# Patient Record
Sex: Male | Born: 1935 | Race: White | Hispanic: No | Marital: Married | Smoking: Never smoker
Health system: Southern US, Community
[De-identification: ages and names within clinical notes are randomized; demographics above are authoritative.]

## PROBLEM LIST (undated history)

## (undated) DIAGNOSIS — I4891 Unspecified atrial fibrillation: Secondary | ICD-10-CM

## (undated) DIAGNOSIS — I340 Nonrheumatic mitral (valve) insufficiency: Secondary | ICD-10-CM

## (undated) DIAGNOSIS — Z9289 Personal history of other medical treatment: Secondary | ICD-10-CM

## (undated) DIAGNOSIS — F0280 Dementia in other diseases classified elsewhere without behavioral disturbance: Secondary | ICD-10-CM

## (undated) DIAGNOSIS — I2510 Atherosclerotic heart disease of native coronary artery without angina pectoris: Secondary | ICD-10-CM

## (undated) DIAGNOSIS — G309 Alzheimer's disease, unspecified: Secondary | ICD-10-CM

## (undated) HISTORY — DX: Unspecified atrial fibrillation: I48.91

## (undated) HISTORY — DX: Nonrheumatic mitral (valve) insufficiency: I34.0

## (undated) HISTORY — PX: TOTAL KNEE ARTHROPLASTY: SHX125

## (undated) HISTORY — PX: SURGERY SCROTAL / TESTICULAR: SUR1316

## (undated) HISTORY — PX: APPENDECTOMY: SHX54

## (undated) HISTORY — PX: CORONARY ARTERY BYPASS GRAFT: SHX141

## (undated) HISTORY — DX: Atherosclerotic heart disease of native coronary artery without angina pectoris: I25.10

## (undated) HISTORY — DX: Personal history of other medical treatment: Z92.89

---

## 1998-09-25 HISTORY — PX: CARDIAC CATHETERIZATION: SHX172

## 1998-12-01 ENCOUNTER — Inpatient Hospital Stay (HOSPITAL_COMMUNITY): Admission: RE | Admit: 1998-12-01 | Discharge: 1998-12-09 | Payer: Self-pay | Admitting: Cardiology

## 1998-12-02 ENCOUNTER — Encounter: Payer: Self-pay | Admitting: Surgery

## 1998-12-03 ENCOUNTER — Encounter: Payer: Self-pay | Admitting: Surgery

## 1998-12-07 ENCOUNTER — Encounter: Payer: Self-pay | Admitting: Surgery

## 1998-12-08 ENCOUNTER — Encounter: Payer: Self-pay | Admitting: Surgery

## 2002-07-04 ENCOUNTER — Encounter: Payer: Self-pay | Admitting: Orthopaedic Surgery

## 2002-07-04 ENCOUNTER — Ambulatory Visit (HOSPITAL_COMMUNITY): Admission: RE | Admit: 2002-07-04 | Discharge: 2002-07-04 | Payer: Self-pay | Admitting: Orthopaedic Surgery

## 2002-08-07 ENCOUNTER — Ambulatory Visit (HOSPITAL_COMMUNITY): Admission: RE | Admit: 2002-08-07 | Discharge: 2002-08-07 | Payer: Self-pay | Admitting: Orthopaedic Surgery

## 2004-09-27 ENCOUNTER — Ambulatory Visit: Payer: Self-pay | Admitting: Family Medicine

## 2005-04-17 ENCOUNTER — Ambulatory Visit: Payer: Self-pay | Admitting: Internal Medicine

## 2005-04-17 ENCOUNTER — Ambulatory Visit (HOSPITAL_COMMUNITY): Admission: RE | Admit: 2005-04-17 | Discharge: 2005-04-17 | Payer: Self-pay | Admitting: Internal Medicine

## 2006-09-25 DIAGNOSIS — Z9289 Personal history of other medical treatment: Secondary | ICD-10-CM

## 2006-09-25 HISTORY — DX: Personal history of other medical treatment: Z92.89

## 2009-06-23 ENCOUNTER — Inpatient Hospital Stay (HOSPITAL_COMMUNITY): Admission: RE | Admit: 2009-06-23 | Discharge: 2009-06-25 | Payer: Self-pay | Admitting: Orthopedic Surgery

## 2009-07-26 ENCOUNTER — Encounter: Admission: RE | Admit: 2009-07-26 | Discharge: 2009-09-23 | Payer: Self-pay | Admitting: Orthopedic Surgery

## 2010-05-03 ENCOUNTER — Ambulatory Visit (HOSPITAL_COMMUNITY): Admission: RE | Admit: 2010-05-03 | Discharge: 2010-05-03 | Payer: Self-pay | Admitting: Cardiology

## 2010-05-03 ENCOUNTER — Encounter (INDEPENDENT_AMBULATORY_CARE_PROVIDER_SITE_OTHER): Payer: Self-pay | Admitting: Cardiology

## 2010-06-22 ENCOUNTER — Inpatient Hospital Stay (HOSPITAL_COMMUNITY): Admission: RE | Admit: 2010-06-22 | Discharge: 2010-06-24 | Payer: Self-pay | Admitting: Orthopedic Surgery

## 2010-07-18 ENCOUNTER — Encounter
Admission: RE | Admit: 2010-07-18 | Discharge: 2010-09-22 | Payer: Self-pay | Source: Home / Self Care | Attending: Orthopedic Surgery | Admitting: Orthopedic Surgery

## 2010-12-08 LAB — PROTIME-INR
INR: 1.19 (ref 0.00–1.49)
INR: 1.22 (ref 0.00–1.49)
INR: 1.78 — ABNORMAL HIGH (ref 0.00–1.49)
INR: 2.01 — ABNORMAL HIGH (ref 0.00–1.49)
Prothrombin Time: 15.3 seconds — ABNORMAL HIGH (ref 11.6–15.2)
Prothrombin Time: 15.6 seconds — ABNORMAL HIGH (ref 11.6–15.2)
Prothrombin Time: 20.9 seconds — ABNORMAL HIGH (ref 11.6–15.2)

## 2010-12-08 LAB — CBC
Hemoglobin: 12 g/dL — ABNORMAL LOW (ref 13.0–17.0)
MCH: 33.1 pg (ref 26.0–34.0)
MCH: 33.1 pg (ref 26.0–34.0)
MCHC: 35 g/dL (ref 30.0–36.0)
Platelets: 146 10*3/uL — ABNORMAL LOW (ref 150–400)
Platelets: 164 10*3/uL (ref 150–400)
Platelets: 177 10*3/uL (ref 150–400)
RBC: 3.62 MIL/uL — ABNORMAL LOW (ref 4.22–5.81)
RBC: 4.03 MIL/uL — ABNORMAL LOW (ref 4.22–5.81)
RDW: 13.2 % (ref 11.5–15.5)
WBC: 10.1 10*3/uL (ref 4.0–10.5)

## 2010-12-08 LAB — URINALYSIS, ROUTINE W REFLEX MICROSCOPIC
Bilirubin Urine: NEGATIVE
Glucose, UA: NEGATIVE mg/dL
Hgb urine dipstick: NEGATIVE
Specific Gravity, Urine: 1.012 (ref 1.005–1.030)
Urobilinogen, UA: 1 mg/dL (ref 0.0–1.0)

## 2010-12-08 LAB — TYPE AND SCREEN
ABO/RH(D): A POS
Antibody Screen: NEGATIVE

## 2010-12-08 LAB — BASIC METABOLIC PANEL
BUN: 18 mg/dL (ref 6–23)
Calcium: 8.4 mg/dL (ref 8.4–10.5)
Calcium: 8.7 mg/dL (ref 8.4–10.5)
Chloride: 106 mEq/L (ref 96–112)
Creatinine, Ser: 1.36 mg/dL (ref 0.4–1.5)
Creatinine, Ser: 1.42 mg/dL (ref 0.4–1.5)
GFR calc Af Amer: 59 mL/min — ABNORMAL LOW (ref >60)
GFR calc Af Amer: >60 mL/min (ref >60)
GFR calc non Af Amer: 48 mL/min — ABNORMAL LOW (ref >60)
GFR calc non Af Amer: 51 mL/min — ABNORMAL LOW (ref >60)
Sodium: 136 mEq/L (ref 135–145)

## 2010-12-08 LAB — URINE MICROSCOPIC-ADD ON

## 2010-12-08 LAB — COMPREHENSIVE METABOLIC PANEL
AST: 26 U/L (ref 0–37)
Albumin: 3.6 g/dL (ref 3.5–5.2)
BUN: 18 mg/dL (ref 6–23)
Calcium: 9.2 mg/dL (ref 8.4–10.5)
Creatinine, Ser: 1.31 mg/dL (ref 0.4–1.5)
GFR calc Af Amer: >60 mL/min (ref >60)
GFR calc non Af Amer: 53 mL/min — ABNORMAL LOW (ref >60)

## 2010-12-08 LAB — APTT
aPTT: 31 seconds (ref 24–37)
aPTT: 42 seconds — ABNORMAL HIGH (ref 24–37)

## 2010-12-08 LAB — SURGICAL PCR SCREEN: Staphylococcus aureus: NEGATIVE

## 2010-12-29 LAB — BASIC METABOLIC PANEL
BUN: 21 mg/dL (ref 6–23)
Calcium: 8.3 mg/dL — ABNORMAL LOW (ref 8.4–10.5)
GFR calc non Af Amer: 46 mL/min — ABNORMAL LOW (ref >60)
Glucose, Bld: 178 mg/dL — ABNORMAL HIGH (ref 70–99)
Potassium: 4.4 mEq/L (ref 3.5–5.1)
Sodium: 135 mEq/L (ref 135–145)

## 2010-12-29 LAB — CBC
HCT: 32.4 % — ABNORMAL LOW (ref 39.0–52.0)
Hemoglobin: 11.1 g/dL — ABNORMAL LOW (ref 13.0–17.0)
Platelets: 131 10*3/uL — ABNORMAL LOW (ref 150–400)
RDW: 13.4 % (ref 11.5–15.5)
WBC: 14.6 10*3/uL — ABNORMAL HIGH (ref 4.0–10.5)

## 2010-12-29 LAB — PROTIME-INR: INR: 1.6 — ABNORMAL HIGH (ref 0.00–1.49)

## 2010-12-30 LAB — COMPREHENSIVE METABOLIC PANEL
ALT: 28 U/L (ref 0–53)
AST: 28 U/L (ref 0–37)
Albumin: 3.7 g/dL (ref 3.5–5.2)
Alkaline Phosphatase: 52 U/L (ref 39–117)
CO2: 27 mEq/L (ref 19–32)
Chloride: 102 mEq/L (ref 96–112)
GFR calc Af Amer: >60 mL/min (ref >60)
GFR calc non Af Amer: 57 mL/min — ABNORMAL LOW (ref >60)
Potassium: 4.5 mEq/L (ref 3.5–5.1)
Sodium: 136 mEq/L (ref 135–145)
Total Bilirubin: 0.9 mg/dL (ref 0.3–1.2)

## 2010-12-30 LAB — CBC
HCT: 38.3 % — ABNORMAL LOW (ref 39.0–52.0)
MCHC: 34 g/dL (ref 30.0–36.0)
MCV: 99.8 fL (ref 78.0–100.0)
Platelets: 168 10*3/uL (ref 150–400)
Platelets: 179 10*3/uL (ref 150–400)
RBC: 4.45 MIL/uL (ref 4.22–5.81)
RDW: 12.8 % (ref 11.5–15.5)
WBC: 7.6 10*3/uL (ref 4.0–10.5)

## 2010-12-30 LAB — TYPE AND SCREEN
ABO/RH(D): A POS
Antibody Screen: NEGATIVE

## 2010-12-30 LAB — BASIC METABOLIC PANEL
BUN: 19 mg/dL (ref 6–23)
CO2: 26 mEq/L (ref 19–32)
Chloride: 101 mEq/L (ref 96–112)
Creatinine, Ser: 1.46 mg/dL (ref 0.4–1.5)
Glucose, Bld: 143 mg/dL — ABNORMAL HIGH (ref 70–99)

## 2010-12-30 LAB — URINALYSIS, ROUTINE W REFLEX MICROSCOPIC
Bilirubin Urine: NEGATIVE
Nitrite: NEGATIVE
Specific Gravity, Urine: 1.014 (ref 1.005–1.030)
pH: 7 (ref 5.0–8.0)

## 2010-12-30 LAB — PROTIME-INR
INR: 1.2 (ref 0.00–1.49)
Prothrombin Time: 15.9 seconds — ABNORMAL HIGH (ref 11.6–15.2)

## 2010-12-30 LAB — APTT: aPTT: 36 seconds (ref 24–37)

## 2011-02-10 NOTE — H&P (Signed)
NAME:  Joseph Pena, Joseph Pena                        ACCOUNT NO.:  [id number]   MEDICAL RECORD NO.:  [id number]                   PATIENT TYPE:  OUT   LOCATION:  RAD                                  FACILITY:  [facility]   PHYSICIAN:  J. Darreld Mclean, M.D.              DATE OF BIRTH:  November 30, 1930   DATE OF ADMISSION:  07/04/2002  DATE OF DISCHARGE:  07/04/2002                                HISTORY & PHYSICAL   CHIEF COMPLAINT:  Left knee pain.   HISTORY OF PRESENT ILLNESS:  The patient has been having pain to his left  knee for several months now.  It has gotten progressively worse.  MRI of the  knee shows tricompartmental disk disease with tear of the medial and lateral  menisci.  Questionable tear of the ACL.  The patient has not improved with  conservative treatment, and desires to have surgery.  The patient has had  significant problems with his heart in the past.  Was seen by [facility] by Dr. Jenne Campus, and found that he could undergo  the procedure.  There was no evidence of ischemia.  He had a Cardiolite  study done and there was a 68% ejection fraction.  They felt he was stable  to undergo the procedure.  He had a normal left ventricular systolic  function, moderate left ventricular hypertrophy, marked left atrial  enlargement, and moderate right atrial enlargement.  Moderate pulmonary  hypertension.  Aortic sclerosis with no stenosis.  No significant change  from previous studies done on 03/07/02.   MEDICATIONS:  1. Toprol 12.5 mg q.d.  2. Zocor 40 mg q.d.  3. Vitamin E q.d.  4. Fish oil vitamins.  5. Aspirin.  6. Aleve.  7. Bextra 10 mg.  8. Darvocet-N 100.   ALLERGIES:  No known drug allergies.   HABITS:  Does not smoke, does not drink.   PRIMARY CARE PHYSICIAN:  Dr. Dewaine Conger.   CARDIOLOGIST:  Dr. Jenne Campus.   PAST MEDICAL HISTORY:  1. Appendectomy at 47.  2. Heart bypass surgery in 2000.   FAMILY HISTORY:  Heart disease runs in the family  rather strongly.   SOCIAL HISTORY:  The patient is married and retired.   PHYSICAL EXAMINATION:  VITAL SIGNS:  Blood pressure is 140/94, pulse 74,  respirations 16, afebrile, height 5 feet 7 inches, weight 197.  GENERAL:  The patient is alert, cooperative, and oriented.  HEENT:  Negative.  NECK:  Supple.  LUNGS:  Clear to auscultation and percussion.  HEART:  Regular without murmurs.  ABDOMEN:  Soft, nontender, no masses.  EXTREMITIES:  Left knee tender, mild effusion, pain and tenderness medially  and laterally to the knee.  The right knee is negative.  Other extremities  within normal limits.  NEUROLOGIC:  Grossly normal.   IMPRESSION:  Left knee tear of medial and lateral meniscus.   PLAN:  Operative arthroscopy.  The patient does have heart disease.  This  was discussed, but he is cleared for this, but he is at increased risk and  understands.  Would recommend spinal anesthesia.  Laboratories pending.                                                 Teola Bradley, M.D.    JWK/MEDQ  D:  February 07, 202003  T:  February 07, 202003  Job:  [id number]

## 2011-02-10 NOTE — Op Note (Signed)
NAME:  Joseph Pena, Joseph Pena                        ACCOUNT NO.:  [id number]   MEDICAL RECORD NO.:  [id number]                   PATIENT TYPE:  AMB   LOCATION:  DAY                                  FACILITY:  [facility]   PHYSICIAN:  J. Darreld Mclean, M.D.              DATE OF BIRTH:  Feb 13, 1931   DATE OF PROCEDURE:  08/07/2002  DATE OF DISCHARGE:                                 OPERATIVE REPORT   PREOPERATIVE DIAGNOSIS:  Tear of the medial and lateral meniscus of the left  knee.   POSTOPERATIVE DIAGNOSIS:  Tear of the medial and lateral meniscus of the  left knee.   PROCEDURES:  1. Operative arthroscopy of the left knee.  2. Partial medial and lateral meniscectomy.   SURGEON:  J. Darreld Mclean, M.D.   ASSISTANT:  Candace Cruise, P.A.-C   ANESTHESIA:  Spinal.   TOURNIQUET TIME:  25 minutes.   DRAINS:  None.   INDICATIONS:  The patient is a 77 year old male with pain and tenderness of  the left knee, giving way, pain, locking.  MRI shows tear of the medial  meniscus and lateral meniscus with significant DJD medially.  Surgery is  recommended as conservative treatment has not been successful.  I got  clearance from his cardiac physicians.  Risks and imponderables discussed in  detail with the patient, who appeared to understand and agreed with the  procedure as outlined.   FINDINGS:  The suprapatellar pouch had mild synovitis, and inspection of the  patella revealed grade 2-3 changes.  Medially there were significant grade 4  changes with eburnation down to the bone.  There was a tear of the medial  meniscus, a stellate tear, complex.  No loose bodies.  Anterior cruciate was  intact but is incompetent with tears within the ligament itself.  Laterally  there was a tear of the anterior portion of the lateral meniscus.  The  posterior portion looked good.  The bone quality is much better here with  grade 2 changes.  No loose bodies.   DESCRIPTION OF PROCEDURE:  The patient was placed  supine on the operating  room table and spinal anesthesia was given.  The patient was prepped and  draped in the usual manner.  His leg was elevated, exsanguinated with an  Esmarch bandage, the tourniquet inflated to 300 mmHg.  Esmarch bandage  removed.  Before making an incision, we reverified we had this patient and  we were doing the left knee for an arthroscopy.   An incision was made and a cannula was placed medially, lactated Ringers  instilled in the knee by an infusion pump.  Arthroscope was entered  laterally and the knee systematically examined.  Please see findings above.  Permanent pictures were taken.  The knee with a marked stellate tear, and  there were significant degenerative changes.   The meniscus was then debrided and cut using a meniscal shaver and  a punch.  A good smooth contour was obtained.  Laterally, the meniscus was approached.  There was a tear of the anterior lip most forward, and this area was  debrided using the meniscal shaver and then further with meniscal shaver and  got a good smooth contour.   The knee was systematically re-examined and no pathology found.  The knee  was irrigated with lactated Ringers.  The wounds were reapproximated using 3-  0 nylon interrupted vertical mattress.  Marcaine 0.25% instilled in each  portal.  The tourniquet deflated after 25 minutes, sterile dressing applied,  bulky dressing applied, knee immobilizer applied.   The patient went to recovery in good condition.  The patient given Darvocet-  N 100 for pain.  I will see him in the office in approximately 10-14 days.  Physical therapy has been arranged.  Any difficulty or problems, contact us  through the office or hospital beeper system, numbers have been given.  He  understands this.                                               Teola Bradley, M.D.    JWK/MEDQ  D:  08/07/2002  T:  08/07/2002  Job:  [id number]

## 2011-02-10 NOTE — Op Note (Signed)
NAME:  Joseph Pena, Joseph Pena              ACCOUNT NO.:  [id number]   MEDICAL RECORD NO.:  [id number]          PATIENT TYPE:  AMB   LOCATION:  DAY                           FACILITY:  [facility]   PHYSICIAN:  R. Roetta Sessions, M.D. DATE OF BIRTH:  April 04, 1931   DATE OF PROCEDURE:  04/17/2005  DATE OF DISCHARGE:                                 OPERATIVE REPORT   INDICATIONS FOR PROCEDURE:  The patient is a 77 year old gentleman referred  via courtesy of Dr. Christian Mate for colorectal cancer screening.  He is  devoid of any lower GI tract symptoms.  He has never had his colon imaged.  There is no family history of colorectal neoplasia.  Colonoscopy is being  done as a screening maneuver.  This procedure was discussed with the  patient.  Potential risks, benefits and alternatives were reviewed,  questions answered, he is agreeable.  It is notable the patient has a  prominent 2-3/6 systolic ejection murmur.  He says that this has been  checked out previously, and he does not require SBE prophylaxis.   OPERATIVE PROCEDURE:  Screening colonoscopy.   SURGEON:  R. Roetta Sessions, M.D.   DESCRIPTION OF PROCEDURE:  O2 saturation, blood pressure, pulse,  respirations were monitored throughout the procedure.  Conscious sedation  with versed 3 mg IV, Demerol 75 mg IV in divided doses.  Olympus video  system.   FINDINGS:  Digital rectal exam revealed no abnormalities.  The prep was  adequate from rectum to colon.  Examination of the rectal mucosa to the  retroflexion view of the anal verge revealed only a single anal papilla.  Colonic mucosa was surveyed from the rectum to the sigmoid junction through  the left, transverse, right colon, appendiceal orifice, ileocecal valve and  cecum.  These structures were well seen and photographed for the record.  The Olympus scope was slowly and cautiously withdrawn.  All previously  mentioned mucosal surfaces were again seen.  The patient was noted to have  sigmoid  diverticulum.  The remainder of the colonic mucosa appeared normal.  The patient tolerated the procedure well, was reactive.   IMPRESSION:  1.  Left-sided diverticulum.  The remainder of the colonic mucosa appeared      normal.  2.  Single anal papilla, otherwise normal rectum.   RECOMMENDATIONS:  1.  Diverticulosis literature provided to Mr. Rosengrant.  2.  He was noted to have couplets on the monitor throughout the procedure.      We will go ahead and get a 12-lead EKG in PACU as a reference point.  He      really has not had any chest pain, dyspnea or palpitations recently.       RMR/MEDQ  D:  04/17/2005  T:  04/17/2005  Job:  [id number]   cc:   [address]  [place]  [place] [address]  Fax: [phone]

## 2011-09-26 DIAGNOSIS — Z9289 Personal history of other medical treatment: Secondary | ICD-10-CM

## 2011-09-26 HISTORY — DX: Personal history of other medical treatment: Z92.89

## 2012-12-01 ENCOUNTER — Ambulatory Visit: Payer: Self-pay | Admitting: Cardiovascular Disease

## 2012-12-01 DIAGNOSIS — I4891 Unspecified atrial fibrillation: Secondary | ICD-10-CM | POA: Insufficient documentation

## 2012-12-01 DIAGNOSIS — Z7901 Long term (current) use of anticoagulants: Secondary | ICD-10-CM

## 2013-02-12 ENCOUNTER — Ambulatory Visit: Payer: Self-pay

## 2013-02-19 ENCOUNTER — Ambulatory Visit (INDEPENDENT_AMBULATORY_CARE_PROVIDER_SITE_OTHER): Payer: Medicare Other | Admitting: Pharmacist Clinician (PhC)/ Clinical Pharmacy Specialist

## 2013-02-19 DIAGNOSIS — I4891 Unspecified atrial fibrillation: Secondary | ICD-10-CM

## 2013-02-19 DIAGNOSIS — Z7901 Long term (current) use of anticoagulants: Secondary | ICD-10-CM

## 2013-02-24 ENCOUNTER — Other Ambulatory Visit: Payer: Self-pay | Admitting: *Deleted

## 2013-02-24 MED ORDER — WARFARIN SODIUM 5 MG PO TABS: ORAL_TABLET | ORAL | Status: DC

## 2013-02-24 NOTE — Telephone Encounter (Signed)
rx for warfarin

## 2013-02-26 NOTE — Progress Notes (Signed)
Needs provider

## 2013-04-24 ENCOUNTER — Ambulatory Visit (INDEPENDENT_AMBULATORY_CARE_PROVIDER_SITE_OTHER): Payer: Self-pay | Admitting: Pharmacist

## 2013-04-24 DIAGNOSIS — I4891 Unspecified atrial fibrillation: Secondary | ICD-10-CM

## 2013-04-24 DIAGNOSIS — Z7901 Long term (current) use of anticoagulants: Secondary | ICD-10-CM

## 2013-06-09 ENCOUNTER — Telehealth: Payer: Self-pay | Admitting: Pharmacist Clinician (PhC)/ Clinical Pharmacy Specialist

## 2013-06-09 NOTE — Telephone Encounter (Signed)
Overdue INR letter sent 

## 2013-06-20 ENCOUNTER — Ambulatory Visit: Payer: Self-pay | Admitting: Pharmacist Clinician (PhC)/ Clinical Pharmacy Specialist

## 2013-06-20 DIAGNOSIS — Z7901 Long term (current) use of anticoagulants: Secondary | ICD-10-CM

## 2013-06-20 DIAGNOSIS — I4891 Unspecified atrial fibrillation: Secondary | ICD-10-CM

## 2013-07-21 ENCOUNTER — Other Ambulatory Visit: Payer: Self-pay | Admitting: Cardiovascular Disease

## 2013-07-21 NOTE — Telephone Encounter (Signed)
Rx for Lisinopril and Toprol XL were sent to pharmacy electronically.

## 2013-08-04 ENCOUNTER — Encounter: Payer: Self-pay | Admitting: Cardiovascular Disease

## 2013-08-15 ENCOUNTER — Ambulatory Visit (INDEPENDENT_AMBULATORY_CARE_PROVIDER_SITE_OTHER): Payer: Medicare Other | Admitting: Cardiovascular Disease

## 2013-08-15 ENCOUNTER — Encounter: Payer: Self-pay | Admitting: Cardiovascular Disease

## 2013-08-15 VITALS — BP 140/80 | HR 81 | Ht 69.0 in | Wt 185.0 lb

## 2013-08-15 DIAGNOSIS — I4891 Unspecified atrial fibrillation: Secondary | ICD-10-CM

## 2013-08-15 DIAGNOSIS — E785 Hyperlipidemia, unspecified: Secondary | ICD-10-CM | POA: Insufficient documentation

## 2013-08-15 DIAGNOSIS — I2510 Atherosclerotic heart disease of native coronary artery without angina pectoris: Secondary | ICD-10-CM

## 2013-08-15 DIAGNOSIS — I10 Essential (primary) hypertension: Secondary | ICD-10-CM | POA: Insufficient documentation

## 2013-08-15 NOTE — Progress Notes (Signed)
Patient ID: Joseph Pena, male   DOB: 1931/03/02, 79 y.o.   MRN: [id number]     HPI: Joseph Pena is a 79 y.o. male to the office for six-month cardiology evaluation.  Joseph Pena has established coronary artery disease in 2000 underwent CABG surgery by Dr. Cristie Hem for the LIMA to the LAD, RIMA graft to the RCA. Additional problems include permanent atrial fibrillation for which she's been on chronic Coumadin therapy, hypertension, hyperlipidemia, as well as left bundle branch block. His last nuclear perfusion study done in August 2013 show mild septal abnormality secondary to his bundle branch block. His last echo Doppler study revealed an ejection fraction at 50-55% with moderate MR, moderate TR, and mild AR which was done in August 2013.  Past 6 months, Joseph Pena continues to do well. He denies recurrent chest pain. He does note some fatigue. He is unaware of his heart racing. He denies presyncope or syncope. He also has a history of hyperlipidemia and hypertension.  Past history is also notable for knee surgery 2009 with repeat 2010. He underwent CABG surgery in 2000 there History reviewed. No pertinent past surgical history.  No Known Allergies  Current Outpatient Prescriptions  Medication Sig Dispense Refill  . aspirin 81 MG tablet Take 81 mg by mouth daily.      Joseph Pena 550 MG CAPS Take 2 capsules by mouth daily.      Joseph Pena Kitchen ezetimibe-simvastatin (VYTORIN) 10-40 MG per tablet Take 1 tablet by mouth daily.      Joseph Pena Kitchen lisinopril (PRINIVIL,ZESTRIL) 2.5 MG tablet TAKE 1 TABLET DAILY  30 tablet  6  . metoprolol succinate (TOPROL-XL) 25 MG 24 hr tablet TAKE 1 TABLET DAILY  30 tablet  6  . Multiple Vitamin (MULTIVITAMIN) capsule Take 1 capsule by mouth daily.      Joseph Pena Kitchen warfarin (COUMADIN) 5 MG tablet Take 1 and 1/2 tablets once daily or as directed  45 tablet  3   No current facility-administered medications for this visit.    History   Social History  . Marital Status: Married      Spouse Name: N/A    Number of Children: N/A  . Years of Education: N/A   Occupational History  . Not on file.   Social History Main Topics  . Smoking status: Former Games developer  . Smokeless tobacco: Former Neurosurgeon     Comment: smoked as a teen.  . Alcohol Use: No  . Drug Use: Not on file  . Sexual Activity: Not on file   Other Topics Concern  . Not on file   Social History Narrative  . No narrative on file    Family History  Problem Relation Age of Onset  . Heart disease Father    Distal social history is notable that he is married for 34 years. He is retired. He completed eighth grade education. He has 2 children and 2 grandchildren  ROS is negative for fevers, chills or night sweats.   Joseph Pena to his wife, he is beginning to have some short term memory issues. He denies any rash. He does note some discomfort in the left trapezius region. He denies chest pain. He denies cough or sputum production. He denies wheezing. He denies presyncope. He denies  abdominal symptoms. He denies any blood in his stool or urine. There is no claudication. He denies paresthesias or there is no diabetes history. He denies excess bleeding on Coumadin. He denies cold or heat intolerance. no blood in his  Other comprehensive 12 point system review is negative.  PE BP 140/80  Pulse 81  Ht 5\' 9"  (1.753 m)  Wt 185 lb (83.915 kg)  BMI 27.31 kg/m2  General: Alert, oriented, no distress.  Skin: normal turgor, no rashes HEENT: Normocephalic, atraumatic. Pupils round and reactive; sclera anicteric;no lid lag.  Nose without nasal septal hypertrophy Mouth/Parynx benign; Mallinpatti scale 2 Neck: No JVD, no carotid briuts Lungs: clear to ausculatation and percussion; no wheezing or rales Heart: Irregularly irregular rhythm, s1 s2 normal 1/6 systolic murmur Abdomen: soft, nontender; no hepatosplenomehaly, BS+; abdominal aorta nontender and not dilated by palpation. Pulses 2+ Extremities: no clubbing cyanosis or  edema, Homan's sign negative  Neurologic: grossly nonfocal Psychologic: normal affect and mood.  ECG: Atrial fibrillation with a lipoma branch block and repolarization changes with ventricular rate approximately 80  LABS:  BMET    Component Value Date/Time   NA 136 06/24/2010 0340   K 4.3 06/24/2010 0340   CL 107 06/24/2010 0340   CO2 23 06/24/2010 0340   GLUCOSE 132* 06/24/2010 0340   BUN 18 06/24/2010 0340   CREATININE 1.36 06/24/2010 0340   CALCIUM 8.4 06/24/2010 0340   GFRNONAA 51* 06/24/2010 0340   GFRAA  Value: >60        The eGFR has been calculated using the MDRD equation. This calculation has not been validated in all clinical situations. eGFR's persistently <60 mL/min signify possible Chronic Kidney Disease. 06/24/2010 0340     Hepatic Function Panel     Component Value Date/Time   PROT 6.8 06/17/2010 1226   ALBUMIN 3.6 06/17/2010 1226   AST 26 06/17/2010 1226   ALT 27 06/17/2010 1226   ALKPHOS 56 06/17/2010 1226   BILITOT 0.7 06/17/2010 1226     CBC    Component Value Date/Time   WBC 12.1* 06/24/2010 0340   RBC 3.62* 06/24/2010 0340   HGB 12.0* 06/24/2010 0340   HCT 34.6* 06/24/2010 0340   PLT 146* 06/24/2010 0340   MCV 95.6 06/24/2010 0340   MCH 33.1 06/24/2010 0340   MCHC 34.7 06/24/2010 0340   RDW 13.2 06/24/2010 0340     BNP No results found for this basename: probnp    Lipid Panel  No results found for this basename: chol, trig, hdl, cholhdl, vldl, ldlcalc     RADIOLOGY: No results found.    ASSESSMENT AND PLAN:  Joseph Pena is now 79 years old. He is 14-1/2 years status post CABG revascularization surgery with bilateral arterial conduits. He has permanent atrial fibrillation on Coumadin therapy and his ventricular rate is controlled at approximately 80 with low dose Toprol-XL 25 mg daily. He also is on lisinopril 2.5 mg. His blood pressure is controlled. He does have hyperlipidemia on Vytorin. He tells me Joseph Pena and Joseph Pena have been  checking laboratory. I have previously been seeing Joseph Pena in the [facility] [place] office which is now closed. We discussed with them their desire to potentially to still have opportunity to be seen in [place]. I informed him that I will no longer be going to the [place] office. I will be happy to see him anytime in the future in [place] and we'll tentatively set him appointment for 6 months but if they prefer to be seen in the [facility] medical [facility] heart care office at any point hospital we can arrange a six-month followup appointment in reasonable to be seen by one of the [facility]  physicians  in [place].   Lennette Bihari, MD, [facility]  08/15/2013 9:57 AM

## 2013-08-15 NOTE — Patient Instructions (Signed)
Your physician recommends that you schedule a follow-up appointment in: 6 MONTHS. No changes were made in your therapy today. 

## 2013-11-24 DIAGNOSIS — I4891 Unspecified atrial fibrillation: Secondary | ICD-10-CM | POA: Diagnosis not present

## 2013-12-22 DIAGNOSIS — F3289 Other specified depressive episodes: Secondary | ICD-10-CM | POA: Diagnosis not present

## 2013-12-22 DIAGNOSIS — I4891 Unspecified atrial fibrillation: Secondary | ICD-10-CM | POA: Diagnosis not present

## 2013-12-22 DIAGNOSIS — E785 Hyperlipidemia, unspecified: Secondary | ICD-10-CM | POA: Diagnosis not present

## 2013-12-22 DIAGNOSIS — E559 Vitamin D deficiency, unspecified: Secondary | ICD-10-CM | POA: Diagnosis not present

## 2013-12-22 DIAGNOSIS — F329 Major depressive disorder, single episode, unspecified: Secondary | ICD-10-CM | POA: Diagnosis not present

## 2013-12-22 DIAGNOSIS — N400 Enlarged prostate without lower urinary tract symptoms: Secondary | ICD-10-CM | POA: Diagnosis not present

## 2013-12-22 DIAGNOSIS — Z125 Encounter for screening for malignant neoplasm of prostate: Secondary | ICD-10-CM | POA: Diagnosis not present

## 2013-12-22 DIAGNOSIS — E039 Hypothyroidism, unspecified: Secondary | ICD-10-CM | POA: Diagnosis not present

## 2013-12-22 DIAGNOSIS — Z79899 Other long term (current) drug therapy: Secondary | ICD-10-CM | POA: Diagnosis not present

## 2013-12-22 DIAGNOSIS — I10 Essential (primary) hypertension: Secondary | ICD-10-CM | POA: Diagnosis not present

## 2014-02-02 DIAGNOSIS — I4891 Unspecified atrial fibrillation: Secondary | ICD-10-CM | POA: Diagnosis not present

## 2014-02-12 ENCOUNTER — Encounter: Payer: Self-pay | Admitting: *Deleted

## 2014-02-17 DIAGNOSIS — H2510 Age-related nuclear cataract, unspecified eye: Secondary | ICD-10-CM | POA: Diagnosis not present

## 2014-02-18 ENCOUNTER — Encounter: Payer: Self-pay | Admitting: Cardiology

## 2014-02-18 ENCOUNTER — Ambulatory Visit (INDEPENDENT_AMBULATORY_CARE_PROVIDER_SITE_OTHER): Payer: Medicare Other | Admitting: Cardiology

## 2014-02-18 VITALS — BP 133/77 | HR 80 | Ht 70.0 in | Wt 187.0 lb

## 2014-02-18 DIAGNOSIS — I4891 Unspecified atrial fibrillation: Secondary | ICD-10-CM

## 2014-02-18 MED ORDER — METOPROLOL SUCCINATE ER 25 MG PO TB24: ORAL_TABLET | ORAL | Status: AC

## 2014-02-18 NOTE — Progress Notes (Signed)
    HPI The patient presents for follow up of CAD.  He is new to me.  He had CABG as below.  His last stress test in 2013 showed no ischemia.  His last echo showed moderate MR.  He saw Dr. Tresa Endo.  Since he was last seen he says that he has done very well.  The patient denies any new symptoms such as chest discomfort, neck or arm discomfort. There has been no new shortness of breath, PND or orthopnea. There have been no reported palpitations, presyncope or syncope.  No Known Allergies  Current Outpatient Prescriptions  Medication Sig Dispense Refill  . aspirin 81 MG tablet Take 81 mg by mouth daily.      Marland Kitchen escitalopram (LEXAPRO) 10 MG tablet 5 mg daily.       Marland Kitchen ezetimibe-simvastatin (VYTORIN) 10-40 MG per tablet Take 1 tablet by mouth daily.      . metoprolol succinate (TOPROL-XL) 25 MG 24 hr tablet TAKE 1 TABLET DAILY  30 tablet  6  . Multiple Vitamin (MULTIVITAMIN) capsule Take 1 capsule by mouth daily.      Marland Kitchen warfarin (COUMADIN) 5 MG tablet Take 1 and 1/2 tablets once daily or as directed  45 tablet  3   No current facility-administered medications for this visit.    Past Medical History  Diagnosis Date  . Atrial fibrillation   . CAD (coronary artery disease)   . Mitral regurgitation     Past Surgical History  Procedure Laterality Date  . Coronary artery bypass graft    . Total knee arthroplasty      bilateral  . Appendectomy    . Surgery scrotal / testicular      ROS:  As stated in the HPI and negative for all other systems.  PHYSICAL EXAM BP 133/77  Pulse 80  Ht 5\' 10"  (1.778 m)  Wt 187 lb (84.823 kg)  BMI 26.83 kg/m2 GENERAL:  Well appearing HEENT:  Pupils equal round and reactive, fundi not visualized, oral mucosa unremarkable NECK:  No jugular venous distention, waveform within normal limits, carotid upstroke brisk and symmetric, no bruits, no thyromegaly LYMPHATICS:  No cervical, inguinal adenopathy LUNGS:  Clear to auscultation bilaterally BACK:  No CVA  tenderness CHEST:  Well healed sternotomy scar. HEART:  PMI not displaced or sustained,S1 and S2 within normal limits, no S3, no S4, no clicks, no rubs, apical short systolic murmur non radiating, no diastolic murmurs ABD:  Flat, positive bowel sounds normal in frequency in pitch, no bruits, no rebound, no guarding, no midline pulsatile mass, no hepatomegaly, no splenomegaly EXT:  2 plus pulses throughout, no edema, no cyanosis no clubbing SKIN:  No rashes no nodules NEURO:  Cranial nerves II through XII grossly intact, motor grossly intact throughout PSYCH:  Cognitively intact, oriented to person place and time  EKG:  Atrial fib, rate 78, LBBB, no acute ST T wave changes.    ASSESSMENT AND PLAN  CAD:  The patient has no new sypmtoms.  No further cardiovascular testing is indicated.  We will continue with aggressive risk reduction and meds as listed.  HTN:  The blood pressure is at target. No change in medications is indicated. We will continue with therapeutic lifestyle changes (TLC).  MITRAL REGURGITATION:  I do not suspect that this is worse than previous.  I will follow this clinically.

## 2014-02-18 NOTE — Patient Instructions (Signed)
The current medical regimen is effective;  continue present plan and medications.  Follow up in 1 year with Dr Hochrein.  You will receive a letter in the mail 2 months before you are due.  Please call us when you receive this letter to schedule your follow up appointment.  

## 2014-03-02 ENCOUNTER — Other Ambulatory Visit: Payer: Self-pay | Admitting: Cardiovascular Disease

## 2014-03-02 NOTE — Telephone Encounter (Signed)
LMTCB to clarify lisinopril

## 2014-03-03 ENCOUNTER — Telehealth: Payer: Self-pay | Admitting: Cardiology

## 2014-03-03 ENCOUNTER — Other Ambulatory Visit: Payer: Self-pay | Admitting: *Deleted

## 2014-03-03 NOTE — Telephone Encounter (Signed)
Please cancelled the previous message, pt had the wrong doctor.

## 2014-03-03 NOTE — Telephone Encounter (Signed)
This message was left on voicemail. She wanted somebody to call her,this message was from yesterday.

## 2014-03-03 NOTE — Telephone Encounter (Signed)
LMTCB

## 2014-03-03 NOTE — Telephone Encounter (Signed)
Pt's wife  was on the voicemail from yesterday,need to talk to somebody about his Lisinopril.

## 2014-03-05 ENCOUNTER — Other Ambulatory Visit: Payer: Self-pay

## 2014-03-24 DIAGNOSIS — I4891 Unspecified atrial fibrillation: Secondary | ICD-10-CM | POA: Diagnosis not present

## 2014-03-24 DIAGNOSIS — E785 Hyperlipidemia, unspecified: Secondary | ICD-10-CM | POA: Diagnosis not present

## 2014-03-24 DIAGNOSIS — F3289 Other specified depressive episodes: Secondary | ICD-10-CM | POA: Diagnosis not present

## 2014-03-24 DIAGNOSIS — I10 Essential (primary) hypertension: Secondary | ICD-10-CM | POA: Diagnosis not present

## 2014-03-24 DIAGNOSIS — F329 Major depressive disorder, single episode, unspecified: Secondary | ICD-10-CM | POA: Diagnosis not present

## 2014-04-07 DIAGNOSIS — L600 Ingrowing nail: Secondary | ICD-10-CM | POA: Diagnosis not present

## 2014-04-19 DIAGNOSIS — R319 Hematuria, unspecified: Secondary | ICD-10-CM | POA: Diagnosis not present

## 2014-04-21 DIAGNOSIS — L600 Ingrowing nail: Secondary | ICD-10-CM | POA: Diagnosis not present

## 2014-04-27 ENCOUNTER — Other Ambulatory Visit: Payer: Self-pay | Admitting: Cardiology

## 2014-05-05 DIAGNOSIS — L600 Ingrowing nail: Secondary | ICD-10-CM | POA: Diagnosis not present

## 2014-06-11 DIAGNOSIS — I4891 Unspecified atrial fibrillation: Secondary | ICD-10-CM | POA: Diagnosis not present

## 2014-07-01 DIAGNOSIS — Z23 Encounter for immunization: Secondary | ICD-10-CM | POA: Diagnosis not present

## 2014-07-21 DIAGNOSIS — I482 Chronic atrial fibrillation: Secondary | ICD-10-CM | POA: Diagnosis not present

## 2014-09-22 DIAGNOSIS — I480 Paroxysmal atrial fibrillation: Secondary | ICD-10-CM | POA: Diagnosis not present

## 2014-10-02 DIAGNOSIS — E785 Hyperlipidemia, unspecified: Secondary | ICD-10-CM | POA: Diagnosis not present

## 2014-10-02 DIAGNOSIS — Z79899 Other long term (current) drug therapy: Secondary | ICD-10-CM | POA: Diagnosis not present

## 2014-10-02 DIAGNOSIS — I4891 Unspecified atrial fibrillation: Secondary | ICD-10-CM | POA: Diagnosis not present

## 2014-10-02 DIAGNOSIS — I10 Essential (primary) hypertension: Secondary | ICD-10-CM | POA: Diagnosis not present

## 2014-10-07 DIAGNOSIS — I10 Essential (primary) hypertension: Secondary | ICD-10-CM | POA: Diagnosis not present

## 2014-10-07 DIAGNOSIS — I4891 Unspecified atrial fibrillation: Secondary | ICD-10-CM | POA: Diagnosis not present

## 2014-10-07 DIAGNOSIS — Z79899 Other long term (current) drug therapy: Secondary | ICD-10-CM | POA: Diagnosis not present

## 2014-10-07 DIAGNOSIS — E785 Hyperlipidemia, unspecified: Secondary | ICD-10-CM | POA: Diagnosis not present

## 2014-10-07 DIAGNOSIS — F0390 Unspecified dementia without behavioral disturbance: Secondary | ICD-10-CM | POA: Diagnosis not present

## 2014-10-23 ENCOUNTER — Ambulatory Visit (INDEPENDENT_AMBULATORY_CARE_PROVIDER_SITE_OTHER): Payer: Medicare Other | Admitting: Cardiovascular Disease

## 2014-10-23 ENCOUNTER — Encounter: Payer: Self-pay | Admitting: Cardiovascular Disease

## 2014-10-23 VITALS — BP 138/94 | HR 63 | Ht 70.0 in | Wt 188.0 lb

## 2014-10-23 DIAGNOSIS — I482 Chronic atrial fibrillation, unspecified: Secondary | ICD-10-CM

## 2014-10-23 DIAGNOSIS — E785 Hyperlipidemia, unspecified: Secondary | ICD-10-CM | POA: Diagnosis not present

## 2014-10-23 DIAGNOSIS — I10 Essential (primary) hypertension: Secondary | ICD-10-CM

## 2014-10-23 DIAGNOSIS — I25812 Atherosclerosis of bypass graft of coronary artery of transplanted heart without angina pectoris: Secondary | ICD-10-CM | POA: Diagnosis not present

## 2014-10-23 DIAGNOSIS — I4891 Unspecified atrial fibrillation: Secondary | ICD-10-CM

## 2014-10-23 DIAGNOSIS — I340 Nonrheumatic mitral (valve) insufficiency: Secondary | ICD-10-CM

## 2014-10-23 NOTE — Progress Notes (Signed)
Patient ID: Joseph Pena, male   DOB: 1931-02-11, 81 y.o.   MRN: [id number]      SUBJECTIVE: The patient is an 81 year old male who I am meeting for the first time. He has a history of CABG several years ago as well as mitral regurgitation, hyperlipidemia, and atrial fibrillation. He is doing very well and denies chest pain, palpitations, lightheadedness, leg swelling, and shortness of breath. ECG performed in the office today demonstrates atrial fibrillation with a left bundle branch block.  Soc: Retired from [employer], where he worked for 30 yrs.  Review of Systems: As per "subjective", otherwise negative.  No Known Allergies  Current Outpatient Prescriptions  Medication Sig Dispense Refill  . aspirin 81 MG tablet Take 81 mg by mouth daily.    Marland Kitchen. donepezil (ARICEPT) 10 MG tablet Take 10 mg by mouth at bedtime.    Marland Kitchen. escitalopram (LEXAPRO) 10 MG tablet 5 mg daily.     Marland Kitchen. ezetimibe-simvastatin (VYTORIN) 10-40 MG per tablet Take 1 tablet by mouth daily.    . metoprolol succinate (TOPROL-XL) 25 MG 24 hr tablet TAKE 1 TABLET DAILY 30 tablet 11  . Multiple Vitamin (MULTIVITAMIN) capsule Take 1 capsule by mouth daily.    Marland Kitchen. warfarin (COUMADIN) 5 MG tablet Take 1 and 1/2 tablets once daily or as directed 45 tablet 3   No current facility-administered medications for this visit.    Past Medical History  Diagnosis Date  . Atrial fibrillation   . CAD (coronary artery disease)   . Mitral regurgitation     Past Surgical History  Procedure Laterality Date  . Coronary artery bypass graft      LIMA to LAD, RIMA to RCA 2000  . Total knee arthroplasty      bilateral  . Appendectomy    . Surgery scrotal / testicular      History   Social History  . Marital Status: Married    Spouse Name: N/A    Number of Children: N/A  . Years of Education: N/A   Occupational History  . Not on file.   Social History Main Topics  . Smoking status: Never Smoker   . Smokeless tobacco: Former  [employer]     Comment: smoked as a teen.  . Alcohol Use: No  . Drug Use: No  . Sexual Activity: Not Currently   Other Topics Concern  . Not on file   Social History Narrative     Filed Vitals:   10/23/14 0818  BP: 138/94  Pulse: 63  Height: 5\' 10"  (1.778 m)  Weight: 188 lb (85.276 kg)  SpO2: 99%    PHYSICAL EXAM General: NAD HEENT: Normal. Neck: No JVD, no thyromegaly. Lungs: Clear to auscultation bilaterally with normal respiratory effort. CV: Nondisplaced PMI.  Irregular rhythm, normal S1/S2, no S3/, no murmur. No pretibial or periankle edema.  No carotid bruit.  Normal pedal pulses.  Abdomen: Soft, nontender, no hepatosplenomegaly, no distention.  Neurologic: Alert and oriented x 3.  Psych: Normal affect. Skin: Normal. Musculoskeletal: Normal range of motion, no gross deformities. Extremities: No clubbing or cyanosis.   ECG: Most recent ECG reviewed.      ASSESSMENT AND PLAN: 1. CAD with h/o CABG: Stable ischemic heart disease. Continue aspirin, Toprol-XL, and Vytorin. 2. Hyperlipidemia: On Vytorin. Will obtain copy of lipid panel from PCP. 3. Mitral regurgitation: Appears clinically stable. Will monitor. 4. Atrial fibrillation: Heart rate is controlled on 25 mg of Toprol-XL daily. He is anticoagulated with warfarin. No changes  indicated. 5. Essential hypertension: Reasonably controlled for age. Will monitor.  Dispo: f/u 1 year.  Prentice Docker, M.D., F.A.C.C.

## 2014-10-23 NOTE — Patient Instructions (Signed)
Your physician wants you to follow-up in: 1 year with Dr Koneswaran You will receive a reminder letter in the mail two months in advance. If you don't receive a letter, please call our office to schedule the follow-up appointment.    Your physician recommends that you continue on your current medications as directed. Please refer to the Current Medication list given to you today.     Thank you for choosing [facility] !        

## 2014-11-18 DIAGNOSIS — E785 Hyperlipidemia, unspecified: Secondary | ICD-10-CM | POA: Diagnosis not present

## 2014-11-18 DIAGNOSIS — I10 Essential (primary) hypertension: Secondary | ICD-10-CM | POA: Diagnosis not present

## 2014-11-18 DIAGNOSIS — I4891 Unspecified atrial fibrillation: Secondary | ICD-10-CM | POA: Diagnosis not present

## 2014-11-18 DIAGNOSIS — E559 Vitamin D deficiency, unspecified: Secondary | ICD-10-CM | POA: Diagnosis not present

## 2014-11-18 DIAGNOSIS — E538 Deficiency of other specified B group vitamins: Secondary | ICD-10-CM | POA: Diagnosis not present

## 2014-11-18 DIAGNOSIS — F0390 Unspecified dementia without behavioral disturbance: Secondary | ICD-10-CM | POA: Diagnosis not present

## 2014-11-18 DIAGNOSIS — Z79899 Other long term (current) drug therapy: Secondary | ICD-10-CM | POA: Diagnosis not present

## 2015-01-26 DIAGNOSIS — I10 Essential (primary) hypertension: Secondary | ICD-10-CM | POA: Diagnosis not present

## 2015-01-26 DIAGNOSIS — I4891 Unspecified atrial fibrillation: Secondary | ICD-10-CM | POA: Diagnosis not present

## 2015-01-26 DIAGNOSIS — E785 Hyperlipidemia, unspecified: Secondary | ICD-10-CM | POA: Diagnosis not present

## 2015-01-26 DIAGNOSIS — F0390 Unspecified dementia without behavioral disturbance: Secondary | ICD-10-CM | POA: Diagnosis not present

## 2015-02-25 DIAGNOSIS — I10 Essential (primary) hypertension: Secondary | ICD-10-CM | POA: Diagnosis not present

## 2015-02-25 DIAGNOSIS — I4891 Unspecified atrial fibrillation: Secondary | ICD-10-CM | POA: Diagnosis not present

## 2015-02-25 DIAGNOSIS — Z125 Encounter for screening for malignant neoplasm of prostate: Secondary | ICD-10-CM | POA: Diagnosis not present

## 2015-03-10 DIAGNOSIS — Z7901 Long term (current) use of anticoagulants: Secondary | ICD-10-CM | POA: Diagnosis not present

## 2015-03-10 DIAGNOSIS — I4891 Unspecified atrial fibrillation: Secondary | ICD-10-CM | POA: Diagnosis not present

## 2015-04-01 DIAGNOSIS — I4891 Unspecified atrial fibrillation: Secondary | ICD-10-CM | POA: Diagnosis not present

## 2015-04-05 DIAGNOSIS — I4891 Unspecified atrial fibrillation: Secondary | ICD-10-CM | POA: Diagnosis not present

## 2015-04-05 DIAGNOSIS — Z7901 Long term (current) use of anticoagulants: Secondary | ICD-10-CM | POA: Diagnosis not present

## 2015-04-08 ENCOUNTER — Encounter: Payer: Self-pay | Admitting: *Deleted

## 2015-05-10 ENCOUNTER — Encounter: Payer: Self-pay | Admitting: Cardiovascular Disease

## 2015-05-10 ENCOUNTER — Encounter: Payer: Self-pay | Admitting: Internal Medicine

## 2015-05-11 DIAGNOSIS — Z7901 Long term (current) use of anticoagulants: Secondary | ICD-10-CM | POA: Diagnosis not present

## 2015-05-11 DIAGNOSIS — I4891 Unspecified atrial fibrillation: Secondary | ICD-10-CM | POA: Diagnosis not present

## 2015-06-16 DIAGNOSIS — I4891 Unspecified atrial fibrillation: Secondary | ICD-10-CM | POA: Diagnosis not present

## 2015-07-22 DIAGNOSIS — Z23 Encounter for immunization: Secondary | ICD-10-CM | POA: Diagnosis not present

## 2015-10-21 DIAGNOSIS — F0390 Unspecified dementia without behavioral disturbance: Secondary | ICD-10-CM | POA: Diagnosis not present

## 2015-10-21 DIAGNOSIS — I4891 Unspecified atrial fibrillation: Secondary | ICD-10-CM | POA: Diagnosis not present

## 2015-10-26 ENCOUNTER — Ambulatory Visit (INDEPENDENT_AMBULATORY_CARE_PROVIDER_SITE_OTHER): Payer: Medicare Other | Admitting: Cardiovascular Disease

## 2015-10-26 ENCOUNTER — Encounter: Payer: Self-pay | Admitting: Cardiovascular Disease

## 2015-10-26 VITALS — BP 140/94 | HR 65 | Ht 69.0 in | Wt 187.0 lb

## 2015-10-26 DIAGNOSIS — I10 Essential (primary) hypertension: Secondary | ICD-10-CM

## 2015-10-26 DIAGNOSIS — I25812 Atherosclerosis of bypass graft of coronary artery of transplanted heart without angina pectoris: Secondary | ICD-10-CM

## 2015-10-26 DIAGNOSIS — E785 Hyperlipidemia, unspecified: Secondary | ICD-10-CM

## 2015-10-26 DIAGNOSIS — I4891 Unspecified atrial fibrillation: Secondary | ICD-10-CM

## 2015-10-26 DIAGNOSIS — I340 Nonrheumatic mitral (valve) insufficiency: Secondary | ICD-10-CM

## 2015-10-26 NOTE — Progress Notes (Signed)
Patient ID: Joseph Pena, male   DOB: June 20, 1931, 82 y.o.   MRN: [id number]      SUBJECTIVE: The patient presents for a routine annual visit. He has a history of CABG several years ago as well as mitral regurgitation, hyperlipidemia, and atrial fibrillation.   He is doing very well and denies chest pain, palpitations, lightheadedness, leg swelling, and shortness of breath.   Wife says INR was 2 two days ago at [facility] office.  ECG performed in the office today demonstrates atrial fibrillation with a left bundle branch block.  Soc: Retired from [employer], where he worked for 30 yrs.   Review of Systems: As per "subjective", otherwise negative.  No Known Allergies  Current Outpatient Prescriptions  Medication Sig Dispense Refill  . aspirin 81 MG tablet Take 81 mg by mouth daily.    Marland Kitchen donepezil (ARICEPT) 10 MG tablet Take 10 mg by mouth at bedtime.    Marland Kitchen escitalopram (LEXAPRO) 10 MG tablet 5 mg daily.     Marland Kitchen ezetimibe-simvastatin (VYTORIN) 10-40 MG per tablet Take 1 tablet by mouth daily.    . metoprolol succinate (TOPROL-XL) 25 MG 24 hr tablet TAKE 1 TABLET DAILY 30 tablet 11  . Multiple Vitamin (MULTIVITAMIN) capsule Take 1 capsule by mouth daily.    Marland Kitchen warfarin (COUMADIN) 5 MG tablet Take 1 and 1/2 tablets once daily or as directed 45 tablet 3   No current facility-administered medications for this visit.    Past Medical History  Diagnosis Date  . Atrial fibrillation (HCC)   . CAD (coronary artery disease)   . Mitral regurgitation   . H/O echocardiogram 2013    indications: murmur, EF =50-55%  . H/O echocardiogram 2011    indications: Afib, EF 45-50%  . H/O echocardiogram 2008    atrial fib, EF =>55%  . H/O myocardial perfusion scan 2013    Indications: RBBB, NSSTT    Past Surgical History  Procedure Laterality Date  . Coronary artery bypass graft      LIMA to LAD, RIMA to RCA 2000  . Total knee arthroplasty      bilateral  . Appendectomy    . Surgery scrotal /  testicular    . Cardiac catheterization  2000    60french JR4    Social History   Social History  . Marital Status: Married    Spouse Name: N/A  . Number of Children: N/A  . Years of Education: N/A   Occupational History  . Not on file.   Social History Main Topics  . Smoking status: Never Smoker   . Smokeless tobacco: Former Neurosurgeon     Comment: smoked as a teen.  . Alcohol Use: No  . Drug Use: No  . Sexual Activity: Not Currently   Other Topics Concern  . Not on file   Social History Narrative     Filed Vitals:   10/26/15 0918  BP: 140/94  Pulse: 65  Height:  (1.753 m)  Weight: 187 lb (84.823 kg)  SpO2: 95%    PHYSICAL EXAM General: NAD HEENT: Normal. Neck: No JVD, no thyromegaly. Lungs: Clear to auscultation bilaterally with normal respiratory effort. CV: Regular rate and irregular rhythm, normal S1/S2, no S3, no murmur. No pretibial or periankle edema.  No carotid bruit. Normal pedal pulses. Abdomen: Soft, nontender, no distention.  Neurologic: Alert and oriented x 3.  Psych: Normal affect. Skin: Normal. Musculoskeletal: No gross deformities. Extremities: No clubbing or cyanosis.   ECG: Most recent  ECG reviewed.      ASSESSMENT AND PLAN: 1. CAD with h/o CABG: Stable ischemic heart disease. Continue aspirin, Toprol-XL, and Vytorin.  2. Hyperlipidemia: On Vytorin. Will obtain copy of lipid panel from PCP.  3. Mitral regurgitation: Appears clinically stable. No appreciable murmur on exam. Will monitor.  4. Atrial fibrillation: Heart rate is controlled on 25 mg of Toprol-XL daily. He is anticoagulated with warfarin. No changes indicated.  5. Essential hypertension: Reasonably controlled for age. Will monitor.  Dispo: f/u 1 year.   Prentice Docker, M.D., F.A.C.C.

## 2015-10-26 NOTE — Patient Instructions (Signed)
Medication Instructions:  Your physician recommends that you continue on your current medications as directed. Please refer to the Current Medication list given to you today.   Labwork: I will request a copy of lipids from pcp  Testing/Procedures: none  Follow-Up: Your physician wants you to follow-up in: 1 year with Dr. Rema Jasmine. You will receive a reminder letter in the mail two months in advance. If you don't receive a letter, please call our office to schedule the follow-up appointment.   Any Other Special Instructions Will Be Listed Below (If Applicable).     If you need a refill on your cardiac medications before your next appointment, please call your pharmacy.

## 2016-04-04 ENCOUNTER — Inpatient Hospital Stay (HOSPITAL_COMMUNITY)
Admission: EM | Admit: 2016-04-04 | Discharge: 2016-04-06 | DRG: 603 | Disposition: A | Discharge status: Home Health | Payer: Medicare Other | Attending: Internal Medicine | Admitting: Internal Medicine

## 2016-04-04 ENCOUNTER — Emergency Department (HOSPITAL_COMMUNITY): Payer: Medicare Other

## 2016-04-04 ENCOUNTER — Encounter (HOSPITAL_COMMUNITY): Payer: Self-pay

## 2016-04-04 DIAGNOSIS — R229 Localized swelling, mass and lump, unspecified: Secondary | ICD-10-CM | POA: Diagnosis not present

## 2016-04-04 DIAGNOSIS — L03114 Cellulitis of left upper limb: Secondary | ICD-10-CM | POA: Diagnosis not present

## 2016-04-04 DIAGNOSIS — I482 Chronic atrial fibrillation: Secondary | ICD-10-CM | POA: Diagnosis present

## 2016-04-04 DIAGNOSIS — S299XXA Unspecified injury of thorax, initial encounter: Secondary | ICD-10-CM | POA: Diagnosis not present

## 2016-04-04 DIAGNOSIS — Z833 Family history of diabetes mellitus: Secondary | ICD-10-CM

## 2016-04-04 DIAGNOSIS — Z951 Presence of aortocoronary bypass graft: Secondary | ICD-10-CM

## 2016-04-04 DIAGNOSIS — Z8249 Family history of ischemic heart disease and other diseases of the circulatory system: Secondary | ICD-10-CM | POA: Diagnosis not present

## 2016-04-04 DIAGNOSIS — R6510 Systemic inflammatory response syndrome (SIRS) of non-infectious origin without acute organ dysfunction: Secondary | ICD-10-CM | POA: Diagnosis not present

## 2016-04-04 DIAGNOSIS — Z7982 Long term (current) use of aspirin: Secondary | ICD-10-CM

## 2016-04-04 DIAGNOSIS — R778 Other specified abnormalities of plasma proteins: Secondary | ICD-10-CM

## 2016-04-04 DIAGNOSIS — E871 Hypo-osmolality and hyponatremia: Secondary | ICD-10-CM | POA: Diagnosis not present

## 2016-04-04 DIAGNOSIS — R627 Adult failure to thrive: Secondary | ICD-10-CM

## 2016-04-04 DIAGNOSIS — N183 Chronic kidney disease, stage 3 unspecified: Secondary | ICD-10-CM

## 2016-04-04 DIAGNOSIS — G309 Alzheimer's disease, unspecified: Secondary | ICD-10-CM | POA: Diagnosis present

## 2016-04-04 DIAGNOSIS — Z8261 Family history of arthritis: Secondary | ICD-10-CM

## 2016-04-04 DIAGNOSIS — D72825 Bandemia: Secondary | ICD-10-CM | POA: Diagnosis present

## 2016-04-04 DIAGNOSIS — I2510 Atherosclerotic heart disease of native coronary artery without angina pectoris: Secondary | ICD-10-CM | POA: Diagnosis present

## 2016-04-04 DIAGNOSIS — S0990XA Unspecified injury of head, initial encounter: Secondary | ICD-10-CM | POA: Diagnosis not present

## 2016-04-04 DIAGNOSIS — E869 Volume depletion, unspecified: Secondary | ICD-10-CM | POA: Diagnosis present

## 2016-04-04 DIAGNOSIS — M79632 Pain in left forearm: Secondary | ICD-10-CM | POA: Diagnosis not present

## 2016-04-04 DIAGNOSIS — S59912A Unspecified injury of left forearm, initial encounter: Secondary | ICD-10-CM | POA: Diagnosis not present

## 2016-04-04 DIAGNOSIS — Z96653 Presence of artificial knee joint, bilateral: Secondary | ICD-10-CM | POA: Diagnosis present

## 2016-04-04 DIAGNOSIS — I4891 Unspecified atrial fibrillation: Secondary | ICD-10-CM | POA: Diagnosis present

## 2016-04-04 DIAGNOSIS — R55 Syncope and collapse: Secondary | ICD-10-CM | POA: Diagnosis not present

## 2016-04-04 DIAGNOSIS — F0280 Dementia in other diseases classified elsewhere without behavioral disturbance: Secondary | ICD-10-CM | POA: Diagnosis present

## 2016-04-04 DIAGNOSIS — R531 Weakness: Secondary | ICD-10-CM | POA: Diagnosis not present

## 2016-04-04 DIAGNOSIS — F4489 Other dissociative and conversion disorders: Secondary | ICD-10-CM | POA: Diagnosis not present

## 2016-04-04 DIAGNOSIS — R7989 Other specified abnormal findings of blood chemistry: Secondary | ICD-10-CM

## 2016-04-04 DIAGNOSIS — Z7901 Long term (current) use of anticoagulants: Secondary | ICD-10-CM

## 2016-04-04 DIAGNOSIS — R404 Transient alteration of awareness: Secondary | ICD-10-CM | POA: Diagnosis not present

## 2016-04-04 DIAGNOSIS — R079 Chest pain, unspecified: Secondary | ICD-10-CM | POA: Diagnosis not present

## 2016-04-04 HISTORY — DX: Dementia in other diseases classified elsewhere, unspecified severity, without behavioral disturbance, psychotic disturbance, mood disturbance, and anxiety: F02.80

## 2016-04-04 HISTORY — DX: Alzheimer's disease, unspecified: G30.9

## 2016-04-04 LAB — CBC WITH DIFFERENTIAL/PLATELET
BASOS ABS: 0 10*3/uL (ref 0.0–0.1)
Basophils Relative: 0 %
EOS ABS: 0 10*3/uL (ref 0.0–0.7)
Eosinophils Relative: 0 %
HCT: 43.4 % (ref 39.0–52.0)
Hemoglobin: 15.2 g/dL (ref 13.0–17.0)
LYMPHS ABS: 2.1 10*3/uL (ref 0.7–4.0)
Lymphocytes Relative: 14 %
MCH: 33.8 pg (ref 26.0–34.0)
MCHC: 35 g/dL (ref 30.0–36.0)
MCV: 96.4 fL (ref 78.0–100.0)
Monocytes Absolute: 1.2 10*3/uL — ABNORMAL HIGH (ref 0.1–1.0)
Monocytes Relative: 8 %
NEUTROS ABS: 11.9 10*3/uL — AB (ref 1.7–7.7)
Neutrophils Relative %: 78 %
Platelets: 158 10*3/uL (ref 150–400)
RBC: 4.5 MIL/uL (ref 4.22–5.81)
RDW: 12.5 % (ref 11.5–15.5)
WBC Morphology: INCREASED
WBC: 15.2 10*3/uL — AB (ref 4.0–10.5)

## 2016-04-04 LAB — COMPREHENSIVE METABOLIC PANEL
ALK PHOS: 62 U/L (ref 38–126)
ALT: 18 U/L (ref 17–63)
AST: 23 U/L (ref 15–41)
Albumin: 3.8 g/dL (ref 3.5–5.0)
Anion gap: 5 (ref 5–15)
BILIRUBIN TOTAL: 1.4 mg/dL — AB (ref 0.3–1.2)
BUN: 23 mg/dL — AB (ref 6–20)
CO2: 25 mmol/L (ref 22–32)
CREATININE: 1.29 mg/dL — AB (ref 0.61–1.24)
Calcium: 9.1 mg/dL (ref 8.9–10.3)
Chloride: 104 mmol/L (ref 101–111)
GFR calc Af Amer: 58 mL/min — ABNORMAL LOW (ref >60)
GFR, EST NON AFRICAN AMERICAN: 50 mL/min — AB (ref >60)
GLUCOSE: 107 mg/dL — AB (ref 65–99)
Potassium: 4.3 mmol/L (ref 3.5–5.1)
Sodium: 134 mmol/L — ABNORMAL LOW (ref 135–145)
TOTAL PROTEIN: 7.4 g/dL (ref 6.5–8.1)

## 2016-04-04 LAB — MAGNESIUM: Magnesium: 1.8 mg/dL (ref 1.7–2.4)

## 2016-04-04 LAB — URINE MICROSCOPIC-ADD ON: Bacteria, UA: NONE SEEN

## 2016-04-04 LAB — URINALYSIS, ROUTINE W REFLEX MICROSCOPIC
Bilirubin Urine: NEGATIVE
GLUCOSE, UA: NEGATIVE mg/dL
KETONES UR: NEGATIVE mg/dL
Leukocytes, UA: NEGATIVE
Nitrite: NEGATIVE
PH: 6.5 (ref 5.0–8.0)
Specific Gravity, Urine: 1.02 (ref 1.005–1.030)

## 2016-04-04 LAB — I-STAT CG4 LACTIC ACID, ED
LACTIC ACID, VENOUS: 1.74 mmol/L (ref 0.5–1.9)
Lactic Acid, Venous: 1.88 mmol/L (ref 0.5–1.9)

## 2016-04-04 LAB — PHOSPHORUS: Phosphorus: 3 mg/dL (ref 2.5–4.6)

## 2016-04-04 LAB — TROPONIN I
Troponin I: 0.04 ng/mL (ref <0.03)
Troponin I: <0.03 ng/mL (ref <0.03)

## 2016-04-04 LAB — TSH: TSH: 0.482 u[IU]/mL (ref 0.350–4.500)

## 2016-04-04 LAB — PROTIME-INR
INR: 2.08 — ABNORMAL HIGH (ref 0.00–1.49)
Prothrombin Time: 23.2 seconds — ABNORMAL HIGH (ref 11.6–15.2)

## 2016-04-04 MED ORDER — METOPROLOL SUCCINATE ER 25 MG PO TB24
25.0000 mg | ORAL_TABLET | Freq: Every day | ORAL | Status: DC
  Administered 2016-04-05 – 2016-04-06 (×2): 25 mg via ORAL
  Filled 2016-04-04 (×2): qty 1

## 2016-04-04 MED ORDER — VANCOMYCIN HCL 10 G IV SOLR
1500.0000 mg | INTRAVENOUS | Status: DC
  Administered 2016-04-05: 1500 mg via INTRAVENOUS
  Filled 2016-04-04 (×2): qty 1500

## 2016-04-04 MED ORDER — ESCITALOPRAM OXALATE 10 MG PO TABS
10.0000 mg | ORAL_TABLET | Freq: Every day | ORAL | Status: DC
  Administered 2016-04-05 – 2016-04-06 (×2): 10 mg via ORAL
  Filled 2016-04-04 (×2): qty 1

## 2016-04-04 MED ORDER — ASPIRIN 81 MG PO CHEW
81.0000 mg | CHEWABLE_TABLET | Freq: Every day | ORAL | Status: DC
  Administered 2016-04-05 – 2016-04-06 (×2): 81 mg via ORAL
  Filled 2016-04-04 (×2): qty 1

## 2016-04-04 MED ORDER — DONEPEZIL HCL 5 MG PO TABS
10.0000 mg | ORAL_TABLET | Freq: Every day | ORAL | Status: DC
  Administered 2016-04-04 – 2016-04-05 (×2): 10 mg via ORAL
  Filled 2016-04-04 (×2): qty 2

## 2016-04-04 MED ORDER — PIPERACILLIN-TAZOBACTAM 3.375 G IVPB
3.3750 g | Freq: Three times a day (TID) | INTRAVENOUS | Status: DC
  Administered 2016-04-05 (×2): 3.375 g via INTRAVENOUS
  Filled 2016-04-04 (×2): qty 50

## 2016-04-04 MED ORDER — PIPERACILLIN-TAZOBACTAM 3.375 G IVPB 30 MIN
3.3750 g | Freq: Once | INTRAVENOUS | Status: AC
  Administered 2016-04-04: 3.375 g via INTRAVENOUS
  Filled 2016-04-04: qty 50

## 2016-04-04 MED ORDER — WARFARIN - PHYSICIAN DOSING INPATIENT: Freq: Every day | Status: DC

## 2016-04-04 MED ORDER — VANCOMYCIN HCL IN DEXTROSE 1-5 GM/200ML-% IV SOLN
1000.0000 mg | Freq: Once | INTRAVENOUS | Status: AC
  Administered 2016-04-04: 1000 mg via INTRAVENOUS
  Filled 2016-04-04: qty 200

## 2016-04-04 MED ORDER — MEMANTINE HCL ER 7 MG PO CP24
21.0000 mg | ORAL_CAPSULE | Freq: Every day | ORAL | Status: DC
  Administered 2016-04-05 – 2016-04-06 (×2): 21 mg via ORAL
  Filled 2016-04-04 (×2): qty 3
  Filled 2016-04-04: qty 1

## 2016-04-04 MED ORDER — ADULT MULTIVITAMIN W/MINERALS CH
1.0000 | ORAL_TABLET | Freq: Every day | ORAL | Status: DC
  Administered 2016-04-05 – 2016-04-06 (×2): 1 via ORAL
  Filled 2016-04-04 (×2): qty 1

## 2016-04-04 MED ORDER — EZETIMIBE-SIMVASTATIN 10-40 MG PO TABS
1.0000 | ORAL_TABLET | Freq: Every day | ORAL | Status: DC
  Filled 2016-04-04: qty 1

## 2016-04-04 MED ORDER — WARFARIN SODIUM 5 MG PO TABS: 7.5000 mg | ORAL_TABLET | Freq: Every day | ORAL | Status: DC

## 2016-04-04 MED ORDER — SODIUM CHLORIDE 0.9% FLUSH
3.0000 mL | Freq: Two times a day (BID) | INTRAVENOUS | Status: DC
  Administered 2016-04-04 – 2016-04-06 (×4): 3 mL via INTRAVENOUS

## 2016-04-04 MED ORDER — SODIUM CHLORIDE 0.9 % IV SOLN
INTRAVENOUS | Status: DC
  Administered 2016-04-04 – 2016-04-06 (×3): via INTRAVENOUS

## 2016-04-04 NOTE — Care Management (Signed)
Patient's daughter called and asked for a private duty agency list. List faxed to ED. ED RN made aware and will give list to wife of patient.

## 2016-04-04 NOTE — Progress Notes (Signed)
Pharmacy Antibiotic Note  Rory PercyHendley D Vea is a 82 y.o. male admitted on 04/04/2016 with cellulitis.  Pharmacy has been consulted for Vancomycin and zosyn dosing. Vancomycin 1gm given in ED and zosyn 3.375gm in ED Plan: Vancomycin 1500mg  IV every 24 hours.  Goal trough 10-15 mcg/mL. Zosyn 3.375g IV q8h (4 hour infusion).  Height: 5\' 8"  (172.7 cm) Weight: 178 lb 4.8 oz (80.876 kg) IBW/kg (Calculated) : 68.4  Temp (24hrs), Avg:98.3 F (36.8 C), Min:98.2 F (36.8 C), Max:98.3 F (36.8 C)   Recent Labs Lab 04/04/16 1420 04/04/16 1431 04/04/16 1756  WBC 15.2*  --   --   CREATININE 1.29*  --   --   LATICACIDVEN  --  1.74 1.88    Estimated Creatinine Clearance: 42.7 mL/min (by C-G formula based on Cr of 1.29).    No Known Allergies  Antimicrobials this admission: Vancomycin 7/11 >>  Zosyn 7/11 >>   Dose adjustments this admission:   Microbiology results: 7/11 BCx: pending  Thank you for allowing pharmacy to be a part of this patient's care.  Elder CyphersLorie Eathel Pajak, BS Loura Backharm D, [place] Clinical Pharmacist Pager [phone] 04/04/2016 10:07 PM

## 2016-04-04 NOTE — ED Notes (Signed)
Pt brought in by EMS. Called by wife, per wife has been weak all morning and unable to ambulate with cane. Leans back. Also reports that minimal po intake since yesterday. Also, fell last week and left hand and arm red, swollen and warm to touch. CBG 122. Pt has alzheimers and pt is able to tell me his name, DOB , that he is in hospital, and state.

## 2016-04-04 NOTE — H&P (Signed)
History and Physical  Joseph Pena:[id number] DOB: [birth year] DOA: 04/04/2016  Referring physician: ER Physician PCP: [name], DO  Outpatient Specialists:    Patient coming from: Home  Chief Complaint: Weakness and cellulitis LUE  HPI: 82 year old Caucasian male with history of Alzheimer's dementia, afib and CAD. Patient was seen alongside patient wife. Most of the history came from patient's wife. According to the patient's wife, patient has been feeling extremely weak, and actually fell about 2 weeks ago with associated bruises and abrasions. The weakness has gotten significantly worse resulting in the decision to bring the patient to the ER for further assessment and management. On presentation to the ER, patient is noted to have LUE cellulitis, with associated leukocytosis. CBC also reveals bandemia with left shift. No headache, no neck pain, no fever or chills, no chest pain, no SOB, no GI symptoms and no urinary symptoms.  ED Course: Pan cultured and given IV antibiotics  Pertinent labs: Leukocytosis with bandemia EKG: Independently reviewed.  Imaging: independently reviewed.   Review of Systems: As in HPI. Negative for fever, visual changes, sore throat, new muscle aches, chest pain, SOB, dysuria, bleeding, n/v/abdominal pain.  Past Medical History  Diagnosis Date  . Atrial fibrillation (HCC)   . CAD (coronary artery disease)   . Mitral regurgitation   . H/O echocardiogram 2013    indications: murmur, EF =50-55%  . H/O echocardiogram 2011    indications: Afib, EF 45-50%  . H/O echocardiogram 2008    atrial fib, EF =>55%  . H/O myocardial perfusion scan 2013    Indications: RBBB, NSSTT  . Alzheimer disease     Past Surgical History  Procedure Laterality Date  . Coronary artery bypass graft      LIMA to LAD, RIMA to RCA 2000  . Total knee arthroplasty      bilateral  . Appendectomy    . Surgery scrotal / testicular    . Cardiac catheterization  2000    71french JR4     reports that he has never smoked. He has quit using smokeless tobacco. He reports that he does not drink alcohol or use illicit drugs.  No Known Allergies  Family History  Problem Relation Age of Onset  . Heart disease Father   . Hypertension Father   . Diabetes Father   . Arthritis Father   . Heart attack Mother      Prior to Admission medications   Medication Sig Start Date End Date Taking? Authorizing Provider  aspirin 81 MG tablet Take 81 mg by mouth daily.   Yes Historical Provider, MD  calcium-vitamin D (OSCAL WITH D) 500-200 MG-UNIT tablet Take 1 tablet by mouth.   Yes Historical Provider, MD  donepezil (ARICEPT) 10 MG tablet Take 10 mg by mouth at bedtime.   Yes Historical Provider, MD  escitalopram (LEXAPRO) 10 MG tablet 5 mg daily.  12/23/13  Yes Historical Provider, MD  ezetimibe-simvastatin (VYTORIN) 10-40 MG per tablet Take 1 tablet by mouth daily.   Yes Historical Provider, MD  metoprolol succinate (TOPROL-XL) 25 MG 24 hr tablet TAKE 1 TABLET DAILY 02/18/14  Yes Rollene Rotunda, MD  Multiple Vitamin (MULTIVITAMIN) capsule Take 1 capsule by mouth daily.   Yes Historical Provider, MD  NAMENDA XR 21 MG CP24 Take 21 mg by mouth daily. 02/22/16  Yes Historical Provider, MD  warfarin (COUMADIN) 5 MG tablet Take 1 and 1/2 tablets once daily or as directed Patient taking differently: Take 1 and 1/2 tablets  once daily or as directed and Tuesday and thursdays 2 tablets (10mg ) 02/24/13  Yes Lennette Bihari, MD    Physical Exam: Filed Vitals:   04/04/16 1719 04/04/16 1730 04/04/16 1830 04/04/16 1920  BP: 166/108 163/96  160/83  Pulse: 110 98  90  Temp:      TempSrc:      Resp: 23 19 20 22   SpO2: 91% 96%  95%    Constitutional:  . Appears calm and comfortable. Cellulitis left forearm Eyes:  . No pallor. No jaundice.  ENMT:  . external ears, nose appear normal Neck:  . Neck is supple. No JVD Respiratory:  . CTA bilaterally, no w/r/r.  . Respiratory  effort normal. No retractions or accessory muscle use Cardiovascular:  . S1S2, irregularly irregular . No LE extremity edema   Abdomen:  . Abdomen is soft and non tender. Organs are difficult to assess. Neurologic:  . Awake and alert. . Moves all limbs.  Wt Readings from Last 3 Encounters:  10/26/15 84.823 kg (187 lb)  10/23/14 85.276 kg (188 lb)  02/18/14 84.823 kg (187 lb)    I have personally reviewed following labs and imaging studies  Labs on Admission:  CBC:  Recent Labs Lab 04/04/16 1420  WBC 15.2*  NEUTROABS 11.9*  HGB 15.2  HCT 43.4  MCV 96.4  PLT 158   Basic Metabolic Panel:  Recent Labs Lab 04/04/16 1420  NA 134*  K 4.3  CL 104  CO2 25  GLUCOSE 107*  BUN 23*  CREATININE 1.29*  CALCIUM 9.1   Liver Function Tests:  Recent Labs Lab 04/04/16 1420  AST 23  ALT 18  ALKPHOS 62  BILITOT 1.4*  PROT 7.4  ALBUMIN 3.8   No results for input(s): LIPASE, AMYLASE in the last 168 hours. No results for input(s): AMMONIA in the last 168 hours. Coagulation Profile:  Recent Labs Lab 04/04/16 1420  INR 2.08*   Cardiac Enzymes:  Recent Labs Lab 04/04/16 1420  TROPONINI 0.04*   BNP (last 3 results) No results for input(s): PROBNP in the last 8760 hours. HbA1C: No results for input(s): HGBA1C in the last 72 hours. CBG: No results for input(s): GLUCAP in the last 168 hours. Lipid Profile: No results for input(s): CHOL, HDL, LDLCALC, TRIG, CHOLHDL, LDLDIRECT in the last 72 hours. Thyroid Function Tests: No results for input(s): TSH, T4TOTAL, FREET4, T3FREE, THYROIDAB in the last 72 hours. Anemia Panel: No results for input(s): VITAMINB12, FOLATE, FERRITIN, TIBC, IRON, RETICCTPCT in the last 72 hours. Urine analysis:    Component Value Date/Time   COLORURINE YELLOW 04/04/2016 1515   APPEARANCEUR CLEAR 04/04/2016 1515   LABSPEC 1.020 04/04/2016 1515   PHURINE 6.5 04/04/2016 1515   GLUCOSEU NEGATIVE 04/04/2016 1515   HGBUR SMALL* 04/04/2016  1515   BILIRUBINUR NEGATIVE 04/04/2016 1515   KETONESUR NEGATIVE 04/04/2016 1515   PROTEINUR >300* 04/04/2016 1515   UROBILINOGEN 1.0 06/17/2010 1239   NITRITE NEGATIVE 04/04/2016 1515   LEUKOCYTESUR NEGATIVE 04/04/2016 1515   Sepsis Labs: @LABRCNTIP (procalcitonin:4,lacticidven:4) )No results found for this or any previous visit (from the past 240 hour(s)).    Radiological Exams on Admission: Dg Chest 2 View  04/04/2016  CLINICAL DATA:  Pain following fall EXAM: CHEST  2 VIEW COMPARISON:  June 17, 2010 FINDINGS: There is no appreciable edema or consolidation. There is cardiomegaly with pulmonary vascularity within normal limits. Patient is status post coronary artery bypass grafting. There is atherosclerotic calcification in the aorta. No adenopathy. There is mild degenerative  change in the thoracic spine. IMPRESSION: Stable cardiomegaly. No edema or consolidation. Aortic atherosclerosis. Electronically Signed   By: Bretta BangWilliam  Woodruff III M.D.   On: 04/04/2016 15:20   Dg Forearm Left  04/04/2016  CLINICAL DATA:  Pain following fall EXAM: LEFT FOREARM - 2 VIEW COMPARISON:  None. FINDINGS: Frontal and lateral views were obtained. There is no demonstrable fracture or dislocation. There is mild osteoarthritic change in the proximal carpal row. No erosive change. No abnormal periosteal reaction. No elbow joint effusion. 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 No fracture or dislocation. Electronically Signed   By: Bretta BangWilliam  Woodruff III M.D.   On: 04/04/2016 15:22   Ct Head Wo Contrast  04/04/2016  CLINICAL DATA:  Fall last week, weakness EXAM: CT HEAD WITHOUT CONTRAST TECHNIQUE: Contiguous axial images were obtained from the base of the skull through the vertex without intravenous contrast. COMPARISON:  None. FINDINGS: Brain: No intracranial hemorrhage, mass effect or midline shift. No acute cortical infarction. Mild cerebral atrophy. Mild periventricular white  matter decreased attenuation probable due to chronic small vessel ischemic changes. No mass lesion is noted on this unenhanced scan. Vascular: Atherosclerotic calcifications of vertebral arteries are noted. Mild atherosclerotic calcifications of carotid siphon. Skull: No skull fracture is noted. Sinuses/Orbits: Paranasal sinuses and mastoid air cells are unremarkable. Other: None IMPRESSION: No acute intracranial abnormality. Mild cerebral atrophy. No definite acute cortical infarction. No skull fracture. No mass lesion is noted on this unenhanced scan. Mild periventricular white matter decreased attenuation probable due to chronic small vessel ischemic changes. Electronically Signed   By: Natasha MeadLiviu  Pop M.D.   On: 04/04/2016 15:50    EKG: Independently reviewed.   Active Problems:   SIRS (systemic inflammatory response syndrome) (HCC)   Assessment/Plan 1. SIRS 2. Cellulitis Left upper extremity 3. Weakness and fatigue 4. Mild hyponatremia 5. Volume depletion 6. Alzheimer's dementia 7. Possible Failure to thrive 8. Chronic atrial fibrillation 9. Troponin of 0.04 in setting of CKD3.   Admit patient to telemetry floor  Blood culture  IV antibiotics  IVF  Monitor and correct electrolyte abnormalities  Trend troponin  Continue anticoagulation for now, but weigh the benefits and risks, considering history of recent fall  Consult PT/OT  Low threshold for Cardiology input in am  DVT prophylaxis: Anticoagulated Code Status: Full Family Communication: Wife Disposition Plan: To be determined   Consults called: low threshold to consult Cardiology in am   Admission status: Inpatient    Time spent: 60 minutes  Berton MountSylvester Drue Harr, MD  [facility] Pager #: [phone] 7PM-7AM contact night coverage as above   04/04/2016, 7:37 PM

## 2016-04-04 NOTE — ED Provider Notes (Signed)
CSN: [id number]     Arrival date & time 04/04/16  1359 History   First MD Initiated Contact with Patient 04/04/16 1408     Chief Complaint  Patient presents with  . Fatigue  . Arm Pain     (Consider location/radiation/quality/duration/timing/severity/associated sxs/prior Treatment) Patient is a 82 y.o. male presenting with weakness. The history is provided by a relative (The patient's wife state that the patient has been very weak lately. And his left arm is swollen red).  Weakness This is a new problem. The current episode started 12 to 24 hours ago. The problem occurs constantly. The problem has not changed since onset.Pertinent negatives include no chest pain, no abdominal pain and no headaches. Nothing aggravates the symptoms. Nothing relieves the symptoms.    Past Medical History  Diagnosis Date  . Atrial fibrillation (HCC)   . CAD (coronary artery disease)   . Mitral regurgitation   . H/O echocardiogram 2013    indications: murmur, EF =50-55%  . H/O echocardiogram 2011    indications: Afib, EF 45-50%  . H/O echocardiogram 2008    atrial fib, EF =>55%  . H/O myocardial perfusion scan 2013    Indications: RBBB, NSSTT  . Alzheimer disease    Past Surgical History  Procedure Laterality Date  . Coronary artery bypass graft      LIMA to LAD, RIMA to RCA 2000  . Total knee arthroplasty      bilateral  . Appendectomy    . Surgery scrotal / testicular    . Cardiac catheterization  2000    [id number]   Family History  Problem Relation Age of Onset  . Heart disease Father   . Hypertension Father   . Diabetes Father   . Arthritis Father   . Heart attack Mother    Social History  Substance Use Topics  . Smoking status: Never Smoker   . Smokeless tobacco: Former Neurosurgeon     Comment: smoked as a teen.  . Alcohol Use: No    Review of Systems  Constitutional: Negative for appetite change and fatigue.  HENT: Negative for congestion, ear discharge and sinus pressure.    Eyes: Negative for discharge.  Respiratory: Negative for cough.   Cardiovascular: Negative for chest pain.  Gastrointestinal: Negative for abdominal pain and diarrhea.  Genitourinary: Negative for frequency and hematuria.  Musculoskeletal: Negative for back pain.       Swollen left arm  Skin: Negative for rash.  Neurological: Positive for weakness. Negative for seizures and headaches.  Psychiatric/Behavioral: Negative for hallucinations.      Allergies  Review of patient's allergies indicates no known allergies.  Home Medications   Prior to Admission medications   Medication Sig Start Date End Date Taking? Authorizing Provider  aspirin 81 MG tablet Take 81 mg by mouth daily.   Yes Historical Provider, MD  calcium-vitamin D (OSCAL WITH D) 500-200 MG-UNIT tablet Take 1 tablet by mouth.   Yes Historical Provider, MD  donepezil (ARICEPT) 10 MG tablet Take 10 mg by mouth at bedtime.   Yes Historical Provider, MD  escitalopram (LEXAPRO) 10 MG tablet 5 mg daily.  12/23/13  Yes Historical Provider, MD  ezetimibe-simvastatin (VYTORIN) 10-40 MG per tablet Take 1 tablet by mouth daily.   Yes Historical Provider, MD  metoprolol succinate (TOPROL-XL) 25 MG 24 hr tablet TAKE 1 TABLET DAILY 02/18/14  Yes Rollene Rotunda, MD  Multiple Vitamin (MULTIVITAMIN) capsule Take 1 capsule by mouth daily.   Yes Historical Provider, MD  NAMENDA XR 21 MG CP24 Take 21 mg by mouth daily. 02/22/16  Yes Historical Provider, MD  warfarin (COUMADIN) 5 MG tablet Take 1 and 1/2 tablets once daily or as directed Patient taking differently: Take 1 and 1/2 tablets once daily or as directed and Tuesday and thursdays 2 tablets (10mg ) 02/24/13  Yes Lennette Biharihomas A Kelly, MD   BP 160/83 mmHg  Pulse 90  Temp(Src) 98.2 F (36.8 C) (Oral)  Resp 22  SpO2 95% Physical Exam  Constitutional: He appears well-developed.  HENT:  Head: Normocephalic.  Eyes: Conjunctivae and EOM are normal. No scleral icterus.  Neck: Neck supple. No  thyromegaly present.  Cardiovascular: Exam reveals no gallop and no friction rub.   No murmur heard. Patient has irregular rate  Pulmonary/Chest: No stridor. He has no wheezes. He has no rales. He exhibits no tenderness.  Abdominal: He exhibits no distension. There is no tenderness. There is no rebound.  Musculoskeletal: Normal range of motion. He exhibits edema.  Patient swelling and redness to left forearm consistent with cellulitis  Lymphadenopathy:    He has no cervical adenopathy.  Neurological: He is alert. He exhibits normal muscle tone. Coordination normal.  Patient is oriented to person only. Patient has dementia  Skin: No rash noted. No erythema.  Psychiatric: He has a normal mood and affect. His behavior is normal.    ED Course  Procedures (including critical care time) Labs Review Labs Reviewed  CBC WITH DIFFERENTIAL/PLATELET - Abnormal; Notable for the following:    WBC 15.2 (*)    Neutro Abs 11.9 (*)    Monocytes Absolute 1.2 (*)    All other components within normal limits  COMPREHENSIVE METABOLIC PANEL - Abnormal; Notable for the following:    Sodium 134 (*)    Glucose, Bld 107 (*)    BUN 23 (*)    Creatinine, Ser 1.29 (*)    Total Bilirubin 1.4 (*)    GFR calc non Af Amer 50 (*)    GFR calc Af Amer 58 (*)    All other components within normal limits  URINALYSIS, ROUTINE W REFLEX MICROSCOPIC (NOT AT [facility]) - Abnormal; Notable for the following:    Hgb urine dipstick SMALL (*)    Protein, ur >300 (*)    All other components within normal limits  TROPONIN I - Abnormal; Notable for the following:    Troponin I 0.04 (*)    All other components within normal limits  PROTIME-INR - Abnormal; Notable for the following:    Prothrombin Time 23.2 (*)    INR 2.08 (*)    All other components within normal limits  URINE MICROSCOPIC-ADD ON - Abnormal; Notable for the following:    Squamous Epithelial / LPF 0-5 (*)    All other components within normal limits  CULTURE,  BLOOD (ROUTINE X 2)  CULTURE, BLOOD (ROUTINE X 2)  I-STAT CG4 LACTIC ACID, ED  I-STAT CG4 LACTIC ACID, ED    Imaging Review Dg Chest 2 View  04/04/2016  CLINICAL DATA:  Pain following fall EXAM: CHEST  2 VIEW COMPARISON:  June 17, 2010 FINDINGS: There is no appreciable edema or consolidation. There is cardiomegaly with pulmonary vascularity within normal limits. Patient is status post coronary artery bypass grafting. There is atherosclerotic calcification in the aorta. No adenopathy. There is mild degenerative change in the thoracic spine. IMPRESSION: Stable cardiomegaly. No edema or consolidation. Aortic atherosclerosis. Electronically Signed   By: Bretta BangWilliam  Woodruff III M.D.   On: 04/04/2016 15:20  Dg Forearm Left  04/04/2016  CLINICAL DATA:  Pain following fall EXAM: LEFT FOREARM - 2 VIEW COMPARISON:  None. FINDINGS: Frontal and lateral views were obtained. There is no demonstrable fracture or dislocation. There is mild osteoarthritic change in the proximal carpal row. No erosive change. No abnormal periosteal reaction. No elbow joint effusion. 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 No fracture or dislocation. Electronically Signed   By: Bretta Bang III M.D.   On: 04/04/2016 15:22   Ct Head Wo Contrast  04/04/2016  CLINICAL DATA:  Fall last week, weakness EXAM: CT HEAD WITHOUT CONTRAST TECHNIQUE: Contiguous axial images were obtained from the base of the skull through the vertex without intravenous contrast. COMPARISON:  None. FINDINGS: Brain: No intracranial hemorrhage, mass effect or midline shift. No acute cortical infarction. Mild cerebral atrophy. Mild periventricular white matter decreased attenuation probable due to chronic small vessel ischemic changes. No mass lesion is noted on this unenhanced scan. Vascular: Atherosclerotic calcifications of vertebral arteries are noted. Mild atherosclerotic calcifications of carotid siphon. Skull: No skull  fracture is noted. Sinuses/Orbits: Paranasal sinuses and mastoid air cells are unremarkable. Other: None IMPRESSION: No acute intracranial abnormality. Mild cerebral atrophy. No definite acute cortical infarction. No skull fracture. No mass lesion is noted on this unenhanced scan. Mild periventricular white matter decreased attenuation probable due to chronic small vessel ischemic changes. Electronically Signed   By: Natasha Mead M.D.   On: 04/04/2016 15:50   I have personally reviewed and evaluated these images and lab results as part of my medical decision-making.   EKG Interpretation   Date/Time:  Tuesday April 04 2016 14:04:41 EDT Ventricular Rate:  113 PR Interval:    QRS Duration: 134 QT Interval:  375 QTC Calculation: 515 R Axis:   83 Text Interpretation:  Atrial fibrillation Left bundle branch block  Confirmed by Analea Muller  MD, Aly Hauser ([id number]) on 04/04/2016 4:52:11 PM      MDM   Final diagnoses:  Cellulitis of arm, left    Patient with cellulitis and atrial fib. Patient will be admitted for IV antibiotics and cycle enzymes    Bethann Berkshire, MD 04/04/16 [phone]

## 2016-04-05 DIAGNOSIS — R778 Other specified abnormalities of plasma proteins: Secondary | ICD-10-CM

## 2016-04-05 DIAGNOSIS — R7989 Other specified abnormal findings of blood chemistry: Secondary | ICD-10-CM

## 2016-04-05 DIAGNOSIS — L03114 Cellulitis of left upper limb: Principal | ICD-10-CM

## 2016-04-05 DIAGNOSIS — N183 Chronic kidney disease, stage 3 unspecified: Secondary | ICD-10-CM

## 2016-04-05 LAB — COMPREHENSIVE METABOLIC PANEL
ALT: 15 U/L — ABNORMAL LOW (ref 17–63)
AST: 21 U/L (ref 15–41)
Albumin: 3.3 g/dL — ABNORMAL LOW (ref 3.5–5.0)
Alkaline Phosphatase: 58 U/L (ref 38–126)
Anion gap: 7 (ref 5–15)
BUN: 19 mg/dL (ref 6–20)
CO2: 24 mmol/L (ref 22–32)
Calcium: 8.8 mg/dL — ABNORMAL LOW (ref 8.9–10.3)
Chloride: 104 mmol/L (ref 101–111)
Creatinine, Ser: 1.26 mg/dL — ABNORMAL HIGH (ref 0.61–1.24)
GFR calc Af Amer: 60 mL/min — ABNORMAL LOW (ref >60)
GFR calc non Af Amer: 51 mL/min — ABNORMAL LOW (ref >60)
Glucose, Bld: 117 mg/dL — ABNORMAL HIGH (ref 65–99)
Potassium: 3.9 mmol/L (ref 3.5–5.1)
Sodium: 135 mmol/L (ref 135–145)
Total Bilirubin: 1.6 mg/dL — ABNORMAL HIGH (ref 0.3–1.2)
Total Protein: 6.8 g/dL (ref 6.5–8.1)

## 2016-04-05 LAB — CBC
HCT: 41 % (ref 39.0–52.0)
Hemoglobin: 14.3 g/dL (ref 13.0–17.0)
MCH: 33.6 pg (ref 26.0–34.0)
MCHC: 34.9 g/dL (ref 30.0–36.0)
MCV: 96.5 fL (ref 78.0–100.0)
Platelets: 145 10*3/uL — ABNORMAL LOW (ref 150–400)
RBC: 4.25 MIL/uL (ref 4.22–5.81)
RDW: 12.5 % (ref 11.5–15.5)
WBC: 13.7 10*3/uL — ABNORMAL HIGH (ref 4.0–10.5)

## 2016-04-05 LAB — PROTIME-INR
INR: 2.34 — ABNORMAL HIGH (ref 0.00–1.49)
Prothrombin Time: 25.4 seconds — ABNORMAL HIGH (ref 11.6–15.2)

## 2016-04-05 LAB — TROPONIN I
Troponin I: 0.03 ng/mL (ref <0.03)
Troponin I: 0.03 ng/mL (ref <0.03)

## 2016-04-05 MED ORDER — CLINDAMYCIN PHOSPHATE 300 MG/50ML IV SOLN
300.0000 mg | Freq: Three times a day (TID) | INTRAVENOUS | Status: DC
  Administered 2016-04-05 – 2016-04-06 (×3): 300 mg via INTRAVENOUS
  Filled 2016-04-05 (×6): qty 50

## 2016-04-05 MED ORDER — WARFARIN SODIUM 5 MG PO TABS
7.5000 mg | ORAL_TABLET | Freq: Once | ORAL | Status: AC
  Administered 2016-04-05: 7.5 mg via ORAL
  Filled 2016-04-05: qty 2

## 2016-04-05 MED ORDER — SIMVASTATIN 20 MG PO TABS
40.0000 mg | ORAL_TABLET | Freq: Every day | ORAL | Status: DC
  Administered 2016-04-05: 40 mg via ORAL
  Filled 2016-04-05: qty 2

## 2016-04-05 MED ORDER — EZETIMIBE 10 MG PO TABS
10.0000 mg | ORAL_TABLET | Freq: Every day | ORAL | Status: DC
  Administered 2016-04-05 – 2016-04-06 (×2): 10 mg via ORAL
  Filled 2016-04-05 (×2): qty 1

## 2016-04-05 MED ORDER — WARFARIN - PHARMACIST DOSING INPATIENT: Freq: Every day | Status: DC

## 2016-04-05 NOTE — Progress Notes (Signed)
ANTICOAGULATION CONSULT NOTE - Initial Consult  Pharmacy Consult for Warfarin Indication: atrial fibrillation  No Known Allergies  Patient Measurements: Height: 5\' 8"  (172.7 cm) Weight: 178 lb 4.8 oz (80.876 kg) IBW/kg (Calculated) : 68.4   Vital Signs: Temp: 97.6 F (36.4 C) (07/12 1431) Temp Source: Oral (07/12 1431) BP: 150/65 mmHg (07/12 1431) Pulse Rate: 76 (07/12 1431)  Labs:  Recent Labs  04/04/16 1420 04/04/16 2204 04/05/16 0505 04/05/16 1036  HGB 15.2  --  14.3  --   HCT 43.4  --  41.0  --   PLT 158  --  145*  --   LABPROT 23.2*  --  25.4*  --   INR 2.08*  --  2.34*  --   CREATININE 1.29*  --  1.26*  --   TROPONINI 0.04* <0.03 0.03* 0.03*    Estimated Creatinine Clearance: 43.7 mL/min (by C-G formula based on Cr of 1.26).   Medical History: Past Medical History  Diagnosis Date  . Atrial fibrillation (HCC)   . CAD (coronary artery disease)   . Mitral regurgitation   . H/O echocardiogram 2013    indications: murmur, EF =50-55%  . H/O echocardiogram 2011    indications: Afib, EF 45-50%  . H/O echocardiogram 2008    atrial fib, EF =>55%  . H/O myocardial perfusion scan 2013    Indications: RBBB, NSSTT  . Alzheimer disease     Medications:  Prescriptions prior to admission  Medication Sig Dispense Refill Last Dose  . aspirin 81 MG tablet Take 81 mg by mouth daily.   04/04/2016 at Unknown time  . calcium-vitamin D (OSCAL WITH D) 500-200 MG-UNIT tablet Take 1 tablet by mouth.   04/04/2016 at Unknown time  . donepezil (ARICEPT) 10 MG tablet Take 10 mg by mouth at bedtime.   04/03/2016 at Unknown time  . escitalopram (LEXAPRO) 10 MG tablet 5 mg daily.    04/04/2016 at Unknown time  . ezetimibe-simvastatin (VYTORIN) 10-40 MG per tablet Take 1 tablet by mouth daily.   04/04/2016 at Unknown time  . metoprolol succinate (TOPROL-XL) 25 MG 24 hr tablet TAKE 1 TABLET DAILY 30 tablet 11 04/04/2016 at 1130  . Multiple Vitamin (MULTIVITAMIN) capsule Take 1 capsule by  mouth daily.   04/04/2016 at Unknown time  . NAMENDA XR 21 MG CP24 Take 21 mg by mouth daily.   04/04/2016 at Unknown time  . warfarin (COUMADIN) 5 MG tablet Take 1 and 1/2 tablets once daily or as directed (Patient taking differently: Take 1 and 1/2 tablets once daily or as directed and Tuesday and thursdays 2 tablets (10mg )) 45 tablet 3 04/04/2016 at 1130    Assessment: 82 yo male admitted to hospital for cellulitis. Patient also on coumadin for chronic Afib. Pharmacy to dose Coumadin. Patient takes 7.5mg  daily except 10mg  on Tuesday and Thursday at home. INR 2.34 today.   Goal of Therapy:  INR 2-3 Monitor platelets by anticoagulation protocol: Yes   Plan:  Coumadin 7.5mg  x 1 today Daily PT/INR Monitor for S/S of bleeding  Elder CyphersLorie Toussaint Golson, BS Loura BackPharm D, BCPS Clinical Pharmacist Pager [phone] 04/05/2016,4:46 PM

## 2016-04-05 NOTE — Evaluation (Signed)
Physical Therapy Evaluation Patient Details Name: Joseph Pena MRN: [id number] DOB: 1931/05/29 Today's Date: 04/05/2016   History of Present Illness  82 yo M admitted 04/04/2016 due to weakness and fall x 2wks prior. In the ED pt was found to have L UE cellulitis and leukocytosis. CBC reveals bandemia with L shift. Troponin of 0.04 in setting of CKD stage 3. Dx: SIRS. PMH: Alzheimers dementia, Afib, CAD, mitral regurgitation, RBBB, CABG in 2000, B TKA, appendectomy.  Clinical Impression  Pt received in bed, wife present.  Pt is agreeable to PT evaluation.  Wife states that pt uses his cane for ambulation part of the time because she insists that he does, however he also is a furniture walker at home.  He is currently independent with ADL's.  Today during PT evaluation he required Min A for supine<>sit, and Min/Mod A for gait with RW due to increasingly crouched gait and need for vc's for upright posture.  Pt also demonstrates L foot drag during gait.  At this point, he is recommended for strict 24/7 supervision/assistance, as well as HHPT, and a RW.     Follow Up Recommendations Home health PT;Supervision/Assistance - 24 hour    Equipment Recommendations  Rolling walker with 5" wheels    Recommendations for Other Services       Precautions / Restrictions Precautions Precautions: Fall Restrictions Weight Bearing Restrictions: No      Mobility  Bed Mobility Overal bed mobility: Needs Assistance Bed Mobility: Supine to Sit     Supine to sit: Min assist     General bed mobility comments: bed flat, pt requires incraesed time and vc's to scoot hips fwd.   Transfers Overall transfer level: Needs assistance Equipment used: Rolling walker (2 wheeled) Transfers: Sit to/from Stand Sit to Stand: Min assist         General transfer comment: pt requires vc's for hand placement.   Ambulation/Gait Ambulation/Gait assistance: Min assist;Mod assist Ambulation Distance  (Feet): 60 Feet Assistive device: Rolling walker (2 wheeled) Gait Pattern/deviations: Trunk flexed;Festinating;Shuffle;Decreased dorsiflexion - left     General Gait Details: Pt demonstrates increasing crouched position during gait, and requires constant vc's for upright posture, as well as to lift feet up.  L foot drag is more noticeable than the right.  Pt requires vc's to stay in the base of the RW.   Stairs            Wheelchair Mobility    Modified Rankin (Stroke Patients Only)       Balance Overall balance assessment: Needs assistance Sitting-balance support: Bilateral upper extremity supported Sitting balance-Leahy Scale: Fair Sitting balance - Comments: posterior lean.  Corrected with hand placement on knees.    Standing balance support: Bilateral upper extremity supported Standing balance-Leahy Scale: Fair                               Pertinent Vitals/Pain Pain Assessment: No/denies pain    Home Living   Living Arrangements: Spouse/significant other   Type of Home: House Home Access: Stairs to enter   [employer] Corporation[employer]trance Stairs-Number of Steps: 1 step Home Layout: Laundry or work area in basement;Able to live on main level with bedroom/bathroom;One level Home Equipment: Shower seat;Cane - quad;Walker - standard;Bedside commode;Grab bars - tub/shower      Prior Function     Gait / Transfers Assistance Needed: Pt states that he uses the quad cane part of the time.  Wife  also reports furniture walking.   ADL's / Homemaking Assistance Needed: Pt states that he is independent with dressing and bathing.  Wife states that he rides with her in the car, but doesn't necessarily get out of the care.  Wife states that they go out to eat at least 2x's/day.         Hand Dominance   Dominant Hand: Right    Extremity/Trunk Assessment   Upper Extremity Assessment: Overall WFL for tasks assessed           Lower Extremity Assessment: Generalized weakness          Communication   Communication: No difficulties  Cognition Arousal/Alertness: Awake/alert Behavior During Therapy: WFL for tasks assessed/performed Overall Cognitive Status: History of cognitive impairments - at baseline                      General Comments      Exercises        Assessment/Plan    PT Assessment Patient needs continued PT services  PT Diagnosis Difficulty walking;Abnormality of gait;Generalized weakness   PT Problem List Decreased strength;Decreased range of motion;Decreased activity tolerance;Decreased balance;Decreased mobility;Decreased coordination;Decreased cognition;Decreased knowledge of use of DME;Decreased safety awareness;Decreased knowledge of precautions  PT Treatment Interventions DME instruction;Gait training;Functional mobility training;Therapeutic activities;Therapeutic exercise;Balance training;Cognitive remediation;Patient/family education   PT Goals (Current goals can be found in the Care Plan section) Acute Rehab PT Goals Patient Stated Goal: Pt states he wants to go home today.  PT Goal Formulation: With patient/family Time For Goal Achievement: 04/12/16 Potential to Achieve Goals: Good    Frequency Min 3X/week   Barriers to discharge        Co-evaluation               End of Session Equipment Utilized During Treatment: Gait belt Activity Tolerance: Patient tolerated treatment well Patient left: in chair;with nursing/sitter in room;with family/visitor present Nurse Communication: Mobility status    Functional Assessment Tool Used: [employer] "6-clicks"  Functional Limitation: Mobility: Walking and moving around Mobility: Walking and Moving Around Current Status [phone]): At least 40 percent but less than 60 percent impaired, limited or restricted Mobility: Walking and Moving Around Goal Status [phone]): At least 20 percent but less than 40 percent impaired, limited or restricted    Time:  1111-1146 PT Time Calculation (min) (ACUTE ONLY): 35 min   Charges:   PT Evaluation $PT Eval Low Complexity: 1 Procedure PT Treatments $Gait Training: 8-22 mins   PT G Codes:   PT G-Codes **NOT FOR INPATIENT CLASS** Functional Assessment Tool Used: [employer] "6-clicks"  Functional Limitation: Mobility: Walking and moving around Mobility: Walking and Moving Around Current Status [phone]): At least 40 percent but less than 60 percent impaired, limited or restricted Mobility: Walking and Moving Around Goal Status [phone]): At least 20 percent but less than 40 percent impaired, limited or restricted   Beth Fabyan Loughmiller, PT, DPT X: [phone]

## 2016-04-05 NOTE — Care Management Important Message (Signed)
Important Message  Patient Details  Name: Joseph Pena MRN: [id number] Date of Birth: Jul 17, 1931   Medicare Important Message Given:  Yes    Tramain Gershman, Chrystine OilerSharley Diane, RN 04/05/2016, 3:36 PM

## 2016-04-05 NOTE — Progress Notes (Addendum)
PROGRESS NOTE    Joseph Pena  HQI:[id number] DOB: 1931-05-14 DOA: 04/04/2016 PCP: [name], DO     Brief Narrative:  82 year old male admitted on 7/11 with complaints of weakness and left arm pain. Was found to have a left upper extremity cellulitis.  Assessment & Plan:   Principal Problem:   Cellulitis of left upper extremity Active Problems:   SIRS (systemic inflammatory response syndrome) (HCC)   CKD (chronic kidney disease) stage 3, GFR 30-59 ml/min   Elevated troponin   Left upper extremity cellulitis -Appears much improved today, see no indication for broad-spectrum antibiotics and will plan to discontinue vancomycin and cefepime and place on clindamycin instead.  Sirs -Due to cellulitis, improved.  Elevated troponin -Denies chest pain or shortness of breath. -Troponins have been flat at 0.03. -EKG with an old left bundle branch block. -Echocardiogram has been requested, as long as ejection fraction is preserved, do not anticipate further workup.  Chronic kidney disease stage III -Renal function is at baseline.  Atrial fibrillation -Rate controlled, continue anticoagulation with Coumadin.   DVT prophylaxis: Lovenox Code Status: Full code Family Communication: Patient only Disposition Plan: Anticipate discharge home in 24-48 hours  Consultants:   None  Procedures:   None  Antimicrobials:   Clindamycin    Subjective: Feels improved, thinks arm is less red and swollen  Objective: Filed Vitals:   04/04/16 2000 04/04/16 2104 04/05/16 0614 04/05/16 1431  BP: 153/70 140/77 132/72 150/65  Pulse: 83 84 80 76  Temp:  98.3 F (36.8 C) 97.6 F (36.4 C) 97.6 F (36.4 C)  TempSrc:  Oral Oral Oral  Resp: 25 20 20 18   Height:  5\' 8"  (1.727 m)    Weight:  80.876 kg (178 lb 4.8 oz)    SpO2: 98% 97% 98% 99%    Intake/Output Summary (Last 24 hours) at 04/05/16 1631 Last data filed at 04/05/16 0541  Gross per 24 hour  Intake    493 ml    Output      0 ml  Net    493 ml   Filed Weights   04/04/16 2104  Weight: 80.876 kg (178 lb 4.8 oz)    Examination:  General exam: Alert, awake, oriented x 3 Respiratory system: Clear to auscultation. Respiratory effort normal. Cardiovascular system:RRR. No murmurs, rubs, gallops. Gastrointestinal system: Abdomen is nondistended, soft and nontender. No organomegaly or masses felt. Normal bowel sounds heard. Central nervous system: Alert and oriented. No focal neurological deficits. Extremities: No C/C/E, +pedal pulses,Left forearm with edema that is clearly marked with surgical pen, receding from margins marked. Skin: No rashes, lesions or ulcers Psychiatry: Judgement and insight appear normal. Mood & affect appropriate.     Data Reviewed: I have personally reviewed following labs and imaging studies  CBC:  Recent Labs Lab 04/04/16 1420 04/05/16 0505  WBC 15.2* 13.7*  NEUTROABS 11.9*  --   HGB 15.2 14.3  HCT 43.4 41.0  MCV 96.4 96.5  PLT 158 145*   Basic Metabolic Panel:  Recent Labs Lab 04/04/16 1420 04/04/16 2204 04/05/16 0505  NA 134*  --  135  K 4.3  --  3.9  CL 104  --  104  CO2 25  --  24  GLUCOSE 107*  --  117*  BUN 23*  --  19  CREATININE 1.29*  --  1.26*  CALCIUM 9.1  --  8.8*  MG  --  1.8  --   PHOS  --  3.0  --  GFR: Estimated Creatinine Clearance: 43.7 mL/min (by C-G formula based on Cr of 1.26). Liver Function Tests:  Recent Labs Lab 04/04/16 1420 04/05/16 0505  AST 23 21  ALT 18 15*  ALKPHOS 62 58  BILITOT 1.4* 1.6*  PROT 7.4 6.8  ALBUMIN 3.8 3.3*   No results for input(s): LIPASE, AMYLASE in the last 168 hours. No results for input(s): AMMONIA in the last 168 hours. Coagulation Profile:  Recent Labs Lab 04/04/16 1420 04/05/16 0505  INR 2.08* 2.34*   Cardiac Enzymes:  Recent Labs Lab 04/04/16 1420 04/04/16 2204 04/05/16 0505 04/05/16 1036  TROPONINI 0.04* <0.03 0.03* 0.03*   BNP (last 3 results) No results for  input(s): PROBNP in the last 8760 hours. HbA1C: No results for input(s): HGBA1C in the last 72 hours. CBG: No results for input(s): GLUCAP in the last 168 hours. Lipid Profile: No results for input(s): CHOL, HDL, LDLCALC, TRIG, CHOLHDL, LDLDIRECT in the last 72 hours. Thyroid Function Tests:  Recent Labs  04/04/16 1713  TSH 0.482   Anemia Panel: No results for input(s): VITAMINB12, FOLATE, FERRITIN, TIBC, IRON, RETICCTPCT in the last 72 hours. Urine analysis:    Component Value Date/Time   COLORURINE YELLOW 04/04/2016 1515   APPEARANCEUR CLEAR 04/04/2016 1515   LABSPEC 1.020 04/04/2016 1515   PHURINE 6.5 04/04/2016 1515   GLUCOSEU NEGATIVE 04/04/2016 1515   HGBUR SMALL* 04/04/2016 1515   BILIRUBINUR NEGATIVE 04/04/2016 1515   KETONESUR NEGATIVE 04/04/2016 1515   PROTEINUR >300* 04/04/2016 1515   UROBILINOGEN 1.0 06/17/2010 1239   NITRITE NEGATIVE 04/04/2016 1515   LEUKOCYTESUR NEGATIVE 04/04/2016 1515   Sepsis Labs: @LABRCNTIP (procalcitonin:4,lacticidven:4)  ) Recent Results (from the past 240 hour(s))  Blood culture (routine x 2)     Status: None (Preliminary result)   Collection Time: 04/04/16  5:13 PM  Result Value Ref Range Status   Specimen Description BLOOD RIGHT ARM  Final   Special Requests BOTTLES DRAWN AEROBIC AND ANAEROBIC 6CC EACH  Final   Culture NO GROWTH < 24 HOURS  Final   Report Status PENDING  Incomplete  Blood culture (routine x 2)     Status: None (Preliminary result)   Collection Time: 04/04/16  5:17 PM  Result Value Ref Range Status   Specimen Description BLOOD RIGHT HAND  Final   Special Requests BOTTLES DRAWN AEROBIC AND ANAEROBIC 5CC EACH  Final   Culture NO GROWTH < 24 HOURS  Final   Report Status PENDING  Incomplete         Radiology Studies: Dg Chest 2 View  04/04/2016  CLINICAL DATA:  Pain following fall EXAM: CHEST  2 VIEW COMPARISON:  June 17, 2010 FINDINGS: There is no appreciable edema or consolidation. There is  cardiomegaly with pulmonary vascularity within normal limits. Patient is status post coronary artery bypass grafting. There is atherosclerotic calcification in the aorta. No adenopathy. There is mild degenerative change in the thoracic spine. IMPRESSION: Stable cardiomegaly. No edema or consolidation. Aortic atherosclerosis. Electronically Signed   By: Bretta BangWilliam  Woodruff III M.D.   On: 04/04/2016 15:20   Dg Forearm Left  04/04/2016  CLINICAL DATA:  Pain following fall EXAM: LEFT FOREARM - 2 VIEW COMPARISON:  None. FINDINGS: Frontal and lateral views were obtained. There is no demonstrable fracture or dislocation. There is mild osteoarthritic change in the proximal carpal row. No erosive change. No abnormal periosteal reaction. No elbow joint effusion. 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 No fracture or dislocation. Electronically Signed  By: Bretta Bang III M.D.   On: 04/04/2016 15:22   Ct Head Wo Contrast  04/04/2016  CLINICAL DATA:  Fall last week, weakness EXAM: CT HEAD WITHOUT CONTRAST TECHNIQUE: Contiguous axial images were obtained from the base of the skull through the vertex without intravenous contrast. COMPARISON:  None. FINDINGS: Brain: No intracranial hemorrhage, mass effect or midline shift. No acute cortical infarction. Mild cerebral atrophy. Mild periventricular white matter decreased attenuation probable due to chronic small vessel ischemic changes. No mass lesion is noted on this unenhanced scan. Vascular: Atherosclerotic calcifications of vertebral arteries are noted. Mild atherosclerotic calcifications of carotid siphon. Skull: No skull fracture is noted. Sinuses/Orbits: Paranasal sinuses and mastoid air cells are unremarkable. Other: None IMPRESSION: No acute intracranial abnormality. Mild cerebral atrophy. No definite acute cortical infarction. No skull fracture. No mass lesion is noted on this unenhanced scan. Mild periventricular white matter  decreased attenuation probable due to chronic small vessel ischemic changes. Electronically Signed   By: Natasha Mead M.D.   On: 04/04/2016 15:50        Scheduled Meds: . aspirin  81 mg Oral Daily  . donepezil  10 mg Oral QHS  . escitalopram  10 mg Oral Daily  . ezetimibe  10 mg Oral Daily  . memantine  21 mg Oral Daily  . metoprolol succinate  25 mg Oral Daily  . multivitamin with minerals  1 tablet Oral Daily  . piperacillin-tazobactam (ZOSYN)  IV  3.375 g Intravenous Q8H  . simvastatin  40 mg Oral q1800  . sodium chloride flush  3 mL Intravenous Q12H  . vancomycin  1,500 mg Intravenous Q24H  . warfarin  7.5 mg Oral q1800  . Warfarin - Physician Dosing Inpatient   Does not apply q1800   Continuous Infusions: . sodium chloride 75 mL/hr at 04/04/16 2145     LOS: 1 day    Time spent: 25 minutes. Greater than 50% of this time was spent in direct contact with the patient coordinating care.     Chaya Jan, MD [facility] Pager [phone]  If 7PM-7AM, please contact night-coverage [email or web address] Password [facility] 04/05/2016, 4:31 PM

## 2016-04-06 ENCOUNTER — Inpatient Hospital Stay (HOSPITAL_COMMUNITY): Payer: Medicare Other

## 2016-04-06 DIAGNOSIS — R55 Syncope and collapse: Secondary | ICD-10-CM

## 2016-04-06 LAB — ECHOCARDIOGRAM COMPLETE
AVLVOTPG: 9 mmHg
CHL CUP DOP CALC LVOT VTI: 24.8 cm
CHL CUP MV DEC (S): 176
CHL CUP RV SYS PRESS: 46 mmHg
CHL CUP STROKE VOLUME: 47 mL
EWDT: 176 ms
FS: 33 % (ref 28–44)
HEIGHTINCHES: 68 in
IV/PV OW: 1.02
LA diam end sys: 54 mm
LA vol A4C: 147 ml
LADIAMINDEX: 2.72 cm/m2
LASIZE: 54 mm
LAVOL: 124 mL
LAVOLIN: 62.5 mL/m2
LDCA: 2.54 cm2
LV PW d: 12 mm — AB (ref 0.6–1.1)
LV dias vol index: 39 mL/m2
LV sys vol: 30 mL (ref 21–61)
LVDIAVOL: 77 mL (ref 62–150)
LVOT SV: 63 mL
LVOT diameter: 18 mm
LVOTPV: 151 cm/s
LVSYSVOLIN: 15 mL/m2
MV Peak grad: 6 mmHg
MVPKEVEL: 122 m/s
P 1/2 time: 679 ms
RV TAPSE: 11.1 mm
Reg peak vel: 328 cm/s
Simpson's disk: 61
TR max vel: 328 cm/s
VTI: 166 cm
WEIGHTICAEL: 2852.8 [oz_av]

## 2016-04-06 LAB — CBC
HCT: 41.4 % (ref 39.0–52.0)
Hemoglobin: 14.5 g/dL (ref 13.0–17.0)
MCH: 33.9 pg (ref 26.0–34.0)
MCHC: 35 g/dL (ref 30.0–36.0)
MCV: 96.7 fL (ref 78.0–100.0)
PLATELETS: 157 10*3/uL (ref 150–400)
RBC: 4.28 MIL/uL (ref 4.22–5.81)
RDW: 12.7 % (ref 11.5–15.5)
WBC: 9.7 10*3/uL (ref 4.0–10.5)

## 2016-04-06 LAB — BASIC METABOLIC PANEL
ANION GAP: 7 (ref 5–15)
BUN: 20 mg/dL (ref 6–20)
CALCIUM: 8.6 mg/dL — AB (ref 8.9–10.3)
CO2: 24 mmol/L (ref 22–32)
CREATININE: 1.33 mg/dL — AB (ref 0.61–1.24)
Chloride: 105 mmol/L (ref 101–111)
GFR calc Af Amer: 56 mL/min — ABNORMAL LOW (ref >60)
GFR, EST NON AFRICAN AMERICAN: 48 mL/min — AB (ref >60)
GLUCOSE: 94 mg/dL (ref 65–99)
Potassium: 4 mmol/L (ref 3.5–5.1)
Sodium: 136 mmol/L (ref 135–145)

## 2016-04-06 LAB — PROTIME-INR
INR: 2.64 — ABNORMAL HIGH (ref 0.00–1.49)
Prothrombin Time: 27.8 seconds — ABNORMAL HIGH (ref 11.6–15.2)

## 2016-04-06 MED ORDER — CLINDAMYCIN HCL 300 MG PO CAPS: 300.0000 mg | ORAL_CAPSULE | Freq: Three times a day (TID) | ORAL | Status: DC

## 2016-04-06 MED ORDER — WARFARIN SODIUM 5 MG PO TABS: 7.5000 mg | ORAL_TABLET | Freq: Once | ORAL | Status: DC

## 2016-04-06 NOTE — Progress Notes (Addendum)
ANTICOAGULATION CONSULT NOTE - follow up  Pharmacy Consult for Warfarin Indication: atrial fibrillation  No Known Allergies  Patient Measurements: Height: 5\' 8"  (172.7 cm) Weight: 178 lb 4.8 oz (80.876 kg) IBW/kg (Calculated) : 68.4   Vital Signs: Temp: 99.2 F (37.3 C) (07/13 0539) Temp Source: Oral (07/13 0539) BP: 140/76 mmHg (07/13 0539) Pulse Rate: 66 (07/13 0539)  Labs:  Recent Labs  04/04/16 1420 04/04/16 2204 04/05/16 0505 04/05/16 1036 04/06/16 0500  HGB 15.2  --  14.3  --  14.5  HCT 43.4  --  41.0  --  41.4  PLT 158  --  145*  --  157  LABPROT 23.2*  --  25.4*  --  27.8*  INR 2.08*  --  2.34*  --  2.64*  CREATININE 1.29*  --  1.26*  --  1.33*  TROPONINI 0.04* <0.03 0.03* 0.03*  --     Estimated Creatinine Clearance: 41.4 mL/min (by C-G formula based on Cr of 1.33).   Medical History: Past Medical History  Diagnosis Date  . Atrial fibrillation (HCC)   . CAD (coronary artery disease)   . Mitral regurgitation   . H/O echocardiogram 2013    indications: murmur, EF =50-55%  . H/O echocardiogram 2011    indications: Afib, EF 45-50%  . H/O echocardiogram 2008    atrial fib, EF =>55%  . H/O myocardial perfusion scan 2013    Indications: RBBB, NSSTT  . Alzheimer disease     Medications:  Prescriptions prior to admission  Medication Sig Dispense Refill Last Dose  . aspirin 81 MG tablet Take 81 mg by mouth daily.   04/04/2016 at Unknown time  . calcium-vitamin D (OSCAL WITH D) 500-200 MG-UNIT tablet Take 1 tablet by mouth.   04/04/2016 at Unknown time  . donepezil (ARICEPT) 10 MG tablet Take 10 mg by mouth at bedtime.   04/03/2016 at Unknown time  . escitalopram (LEXAPRO) 10 MG tablet 5 mg daily.    04/04/2016 at Unknown time  . ezetimibe-simvastatin (VYTORIN) 10-40 MG per tablet Take 1 tablet by mouth daily.   04/04/2016 at Unknown time  . metoprolol succinate (TOPROL-XL) 25 MG 24 hr tablet TAKE 1 TABLET DAILY 30 tablet 11 04/04/2016 at 1130  . Multiple  Vitamin (MULTIVITAMIN) capsule Take 1 capsule by mouth daily.   04/04/2016 at Unknown time  . NAMENDA XR 21 MG CP24 Take 21 mg by mouth daily.   04/04/2016 at Unknown time  . warfarin (COUMADIN) 5 MG tablet Take 1 and 1/2 tablets once daily or as directed (Patient taking differently: Take 1 and 1/2 tablets once daily or as directed and Tuesday and thursdays 2 tablets (10mg )) 45 tablet 3 04/04/2016 at 1130    Assessment: 82 yo male admitted to hospital for cellulitis. Patient also on coumadin for chronic Afib. Pharmacy to dose Coumadin. Patient takes 7.5mg  daily except 10mg  on Tuesday and Thursday at home. INR 2.64 today.  Will continue with 7.5mg  today with increase in INR, maybe due to abx zosyn and vancomycin. Resume home schedule next week.  Goal of Therapy:  INR 2-3 Monitor platelets by anticoagulation protocol: Yes   Plan:  Coumadin 7.5mg  x 1 today Daily PT/INR Monitor for S/S of bleeding  Elder CyphersLorie Kendyl Festa, BS Loura BackPharm D, BCPS Clinical Pharmacist Pager [phone] 04/06/2016,8:29 AM

## 2016-04-06 NOTE — Progress Notes (Signed)
Physical Therapy Treatment Patient Details Name: Joseph Pena MRN: [id number] DOB: [birth year] Today's Date: 04/06/2016    History of Present Illness 82 yo M admitted 04/04/2016 due to weakness and fall x 2wks prior. In the ED pt was found to have L UE cellulitis and leukocytosis. CBC reveals bandemia with L shift. Troponin of 0.04 in setting of CKD stage 3. Dx: SIRS. PMH: Alzheimers dementia, Afib, CAD, mitral regurgitation, RBBB, CABG in 2000, B TKA, appendectomy.    PT Comments    Pt received in bed, wife present, and pt is agreeable to PT evaluation.  Pt demonstrated improved gait technique, however still with crouched posture.  Continue to recommend HHPT and 24/7 supervision upon d/c.   Follow Up Recommendations  Home health PT;Supervision/Assistance - 24 hour     Equipment Recommendations  Rolling walker with 5" wheels    Recommendations for Other Services       Precautions / Restrictions Restrictions Weight Bearing Restrictions: No    Mobility  Bed Mobility Overal bed mobility: Needs Assistance Bed Mobility: Sidelying to Sit;Supine to Sit   Sidelying to sit: Min assist Supine to sit: Mod assist     General bed mobility comments: bed flat  Transfers Overall transfer level: Needs assistance Equipment used: Rolling walker (2 wheeled) Transfers: Sit to/from Stand Sit to Stand: Min assist            Ambulation/Gait Ambulation/Gait assistance: Min assist Ambulation Distance (Feet): 60 Feet Assistive device: Rolling walker (2 wheeled)       General Gait Details: Pt continues to demonstrate crouched position during ambulation.  Improved L foot drag today.     Stairs            Wheelchair Mobility    Modified Rankin (Stroke Patients Only)       Balance Overall balance assessment: Needs assistance Sitting-balance support: Bilateral upper extremity supported Sitting balance-Leahy Scale: Fair     Standing balance support: Bilateral  upper extremity supported Standing balance-Leahy Scale: Fair                      Cognition Arousal/Alertness: Awake/alert Behavior During Therapy: WFL for tasks assessed/performed Overall Cognitive Status: History of cognitive impairments - at baseline                      Exercises General Exercises - Lower Extremity Long Arc Quad: Strengthening;Both;10 reps;Seated Hip Flexion/Marching: AROM;Both;Supine;10 reps Heel Raises: AROM;Both;10 reps;Supine Other Exercises Other Exercises: lumbar rotation in hooklying both directions x 15 reps.     General Comments        Pertinent Vitals/Pain Pain Assessment: No/denies pain    Home Living                      Prior Function            PT Goals (current goals can now be found in the care plan section) Acute Rehab PT Goals Patient Stated Goal: Pt states he wants to go home today.  PT Goal Formulation: With patient/family Time For Goal Achievement: 04/12/16 Potential to Achieve Goals: Good Progress towards PT goals: Progressing toward goals    Frequency  Min 3X/week    PT Plan Current plan remains appropriate    Co-evaluation             End of Session Equipment Utilized During Treatment: Gait belt Activity Tolerance: Patient tolerated treatment well Patient left: with nursing/sitter in room;with  family/visitor present;in bed     Time: 1026-1100 PT Time Calculation (min) (ACUTE ONLY): 34 min  Charges:  $Gait Training: 8-22 mins $Therapeutic Exercise: 8-22 mins                    G Codes:      Beth Londen Bok, PT, DPT X: [phone]

## 2016-04-06 NOTE — Discharge Summary (Signed)
Physician Discharge Summary  Joseph PercyHendley D Pena ZOX:[id number] DOB: 25-Jan-1931 DOA: 04/04/2016  PCP: Samuel JesterYNTHIA BUTLER, DO  Admit date: 04/04/2016 Discharge date: 04/06/2016  Time spent: 45 minutes  Recommendations for Outpatient Follow-up:  -Will be discharged home today. -2-D echo will need to be followed. -Will complete a ten-day course of clindamycin for his left upper extremity cellulitis.   Discharge Diagnoses:  Principal Problem:   Cellulitis of left upper extremity Active Problems:   Atrial fibrillation (HCC)   SIRS (systemic inflammatory response syndrome) (HCC)   CKD (chronic kidney disease) stage 3, GFR 30-59 ml/min   Elevated troponin   Discharge Condition: Stable and improved  Filed Weights   04/04/16 2104  Weight: 80.876 kg (178 lb 4.8 oz)    History of present illness:  As per Dr. Dartha Lodgegbata on 7/11: 82 year old Caucasian male with history of Alzheimer's dementia, afib and CAD. Patient was seen alongside patient wife. Most of the history came from patient's wife. According to the patient's wife, patient has been feeling extremely weak, and actually fell about 2 weeks ago with associated bruises and abrasions. The weakness has gotten significantly worse resulting in the decision to bring the patient to the ER for further assessment and management. On presentation to the ER, patient is noted to have LUE cellulitis, with associated leukocytosis. CBC also reveals bandemia with left shift. No headache, no neck pain, no fever or chills, no chest pain, no SOB, no GI symptoms and no urinary symptoms.  ED Course: Pan cultured and given IV antibiotics   Hospital Course:   Left upper extremity cellulitis -With significant improvement on clindamycin. Will transition over to oral clindamycin to complete 10 days of treatment.  Sirs -Due to cellulitis, resolved, culture data remains negative.  Elevated troponin -Denies chest pain or shortness of breath. -Troponins have been  flat at 0.03. -EKG with an old left bundle branch block. -Echocardiogram has been requested, as long as ejection fraction is preserved, do not anticipate further workup. This has been done but results are pending at the time of discharge.  Chronic kidney disease stage III -Renal function is at baseline.  Atrial fibrillation -Rate controlled, continue anticoagulation with Coumadin.  Procedures:  None   Consultations:  None  Discharge Instructions  Discharge Instructions    Diet - low sodium heart healthy    Complete by:  As directed      Increase activity slowly    Complete by:  As directed             Medication List    TAKE these medications        aspirin 81 MG tablet  Take 81 mg by mouth daily.     calcium-vitamin D 500-200 MG-UNIT tablet  Commonly known as:  OSCAL WITH D  Take 1 tablet by mouth.     clindamycin 300 MG capsule  Commonly known as:  CLEOCIN  Take 1 capsule (300 mg total) by mouth 3 (three) times daily.     donepezil 10 MG tablet  Commonly known as:  ARICEPT  Take 10 mg by mouth at bedtime.     escitalopram 10 MG tablet  Commonly known as:  LEXAPRO  5 mg daily.     ezetimibe-simvastatin 10-40 MG tablet  Commonly known as:  VYTORIN  Take 1 tablet by mouth daily.     metoprolol succinate 25 MG 24 hr tablet  Commonly known as:  TOPROL-XL  TAKE 1 TABLET DAILY  multivitamin capsule  Take 1 capsule by mouth daily.     NAMENDA XR 21 MG Cp24  Generic drug:  Memantine HCl ER  Take 21 mg by mouth daily.     warfarin 5 MG tablet  Commonly known as:  COUMADIN  Take 1 and 1/2 tablets once daily or as directed       No Known Allergies     Follow-up Information    Follow up with CYNTHIA BUTLER, DO. Schedule an appointment as soon as possible for a visit in 2 weeks.   Contact information:   [address] [place] [address] [phone]        The results of significant diagnostics from this hospitalization (including imaging,  microbiology, ancillary and laboratory) are listed below for reference.    Significant Diagnostic Studies: Dg Chest 2 View  04/04/2016  CLINICAL DATA:  Pain following fall EXAM: CHEST  2 VIEW COMPARISON:  June 17, 2010 FINDINGS: There is no appreciable edema or consolidation. There is cardiomegaly with pulmonary vascularity within normal limits. Patient is status post coronary artery bypass grafting. There is atherosclerotic calcification in the aorta. No adenopathy. There is mild degenerative change in the thoracic spine. IMPRESSION: Stable cardiomegaly. No edema or consolidation. Aortic atherosclerosis. Electronically Signed   By: Bretta Bang III M.D.   On: 04/04/2016 15:20   Dg Forearm Left  04/04/2016  CLINICAL DATA:  Pain following fall EXAM: LEFT FOREARM - 2 VIEW COMPARISON:  None. FINDINGS: Frontal and lateral views were obtained. There is no demonstrable fracture or dislocation. There is mild osteoarthritic change in the proximal carpal row. No erosive change. No abnormal periosteal reaction. No elbow joint effusion. 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 No fracture or dislocation. Electronically Signed   By: Bretta Bang III M.D.   On: 04/04/2016 15:22   Ct Head Wo Contrast  04/04/2016  CLINICAL DATA:  Fall last week, weakness EXAM: CT HEAD WITHOUT CONTRAST TECHNIQUE: Contiguous axial images were obtained from the base of the skull through the vertex without intravenous contrast. COMPARISON:  None. FINDINGS: Brain: No intracranial hemorrhage, mass effect or midline shift. No acute cortical infarction. Mild cerebral atrophy. Mild periventricular white matter decreased attenuation probable due to chronic small vessel ischemic changes. No mass lesion is noted on this unenhanced scan. Vascular: Atherosclerotic calcifications of vertebral arteries are noted. Mild atherosclerotic calcifications of carotid siphon. Skull: No skull fracture is noted.  Sinuses/Orbits: Paranasal sinuses and mastoid air cells are unremarkable. Other: None IMPRESSION: No acute intracranial abnormality. Mild cerebral atrophy. No definite acute cortical infarction. No skull fracture. No mass lesion is noted on this unenhanced scan. Mild periventricular white matter decreased attenuation probable due to chronic small vessel ischemic changes. Electronically Signed   By: Natasha Mead M.D.   On: 04/04/2016 15:50    Microbiology: Recent Results (from the past 240 hour(s))  Blood culture (routine x 2)     Status: None (Preliminary result)   Collection Time: 04/04/16  5:13 PM  Result Value Ref Range Status   Specimen Description BLOOD RIGHT ARM  Final   Special Requests BOTTLES DRAWN AEROBIC AND ANAEROBIC 6CC EACH  Final   Culture NO GROWTH 2 DAYS  Final   Report Status PENDING  Incomplete  Blood culture (routine x 2)     Status: None (Preliminary result)   Collection Time: 04/04/16  5:17 PM  Result Value Ref Range Status   Specimen Description BLOOD RIGHT HAND  Final  Special Requests BOTTLES DRAWN AEROBIC AND ANAEROBIC 5CC EACH  Final   Culture NO GROWTH 2 DAYS  Final   Report Status PENDING  Incomplete     Labs: Basic Metabolic Panel:  Recent Labs Lab 04/04/16 1420 04/04/16 2204 04/05/16 0505 04/06/16 0500  NA 134*  --  135 136  K 4.3  --  3.9 4.0  CL 104  --  104 105  CO2 25  --  24 24  GLUCOSE 107*  --  117* 94  BUN 23*  --  19 20  CREATININE 1.29*  --  1.26* 1.33*  CALCIUM 9.1  --  8.8* 8.6*  MG  --  1.8  --   --   PHOS  --  3.0  --   --    Liver Function Tests:  Recent Labs Lab 04/04/16 1420 04/05/16 0505  AST 23 21  ALT 18 15*  ALKPHOS 62 58  BILITOT 1.4* 1.6*  PROT 7.4 6.8  ALBUMIN 3.8 3.3*   No results for input(s): LIPASE, AMYLASE in the last 168 hours. No results for input(s): AMMONIA in the last 168 hours. CBC:  Recent Labs Lab 04/04/16 1420 04/05/16 0505 04/06/16 0500  WBC 15.2* 13.7* 9.7  NEUTROABS 11.9*  --   --     HGB 15.2 14.3 14.5  HCT 43.4 41.0 41.4  MCV 96.4 96.5 96.7  PLT 158 145* 157   Cardiac Enzymes:  Recent Labs Lab 04/04/16 1420 04/04/16 2204 04/05/16 0505 04/05/16 1036  TROPONINI 0.04* <0.03 0.03* 0.03*   BNP: BNP (last 3 results) No results for input(s): BNP in the last 8760 hours.  ProBNP (last 3 results) No results for input(s): PROBNP in the last 8760 hours.  CBG: No results for input(s): GLUCAP in the last 168 hours.     SignedChaya Jan  [facility] Pager: [phone] 04/06/2016, 4:11 PM

## 2016-04-06 NOTE — Progress Notes (Signed)
*  PRELIMINARY RESULTS* Echocardiogram 2D Echocardiogram has been performed.  Stacey DrainWhite, Emilija Bohman J 04/06/2016, 4:34 PM

## 2016-04-06 NOTE — Progress Notes (Signed)
Pt IV and telemetry removed, tolerated well.  Reviewed discharge information with pt and wife, answered questions at this time.  Pt discharging home with HH (walker/PT).

## 2016-04-06 NOTE — Evaluation (Signed)
Occupational Therapy Evaluation Patient Details Name: Joseph Pena MRN: [id number] DOB: 1931-03-26 Today's Date: 04/06/2016    History of Present Illness 82 yo M admitted 04/04/2016 due to weakness and fall x 2wks prior. In the ED pt was found to have L UE cellulitis and leukocytosis. CBC reveals bandemia with L shift. Troponin of 0.04 in setting of CKD stage 3. Dx: SIRS. PMH: Alzheimers dementia, Afib, CAD, mitral regurgitation, RBBB, CABG in 2000, B TKA, appendectomy.   Clinical Impression   Pt awake, alert, agreeable to OT evaluation, wife and sitter present for evaluation. Pt requires min assist for bed mobility, verbal cuing for hand placement. Pt demonstrates posterior lean while seated, cuing required to correct. Pt is able to complete ADL tasks with supervision while seated, min guard/min assist while standing due to LOB and posterior lean. Pt attempted to sit down before reaching bed, mod assist to maintain standing and max verbal cuing to correct and continue to the bed prior to sitting. Pt would benefit from [facility] evaluation to determine level of functioning in home environment and treatment as determined by [facility] to increase safety and independence during ADL tasks. No further acute OT services required at this time.     Follow Up Recommendations  Home health OT;Supervision/Assistance - 24 hour    Equipment Recommendations  None recommended by OT       Precautions / Restrictions Precautions Precautions: Fall Restrictions Weight Bearing Restrictions: No      Mobility Bed Mobility Overal bed mobility: Needs Assistance Bed Mobility: Supine to Sit     Supine to sit: Min assist        Transfers Overall transfer level: Needs assistance Equipment used: Rolling walker (2 wheeled) Transfers: Sit to/from Stand Sit to Stand: Min assist         General transfer comment: pt requires vc's for hand placement.          ADL Overall ADL's : Needs  assistance/impaired Eating/Feeding: Set up   Grooming: Wash/dry hands;Wash/dry face;Oral care;Min guard;Minimal assistance;Standing Grooming Details (indicate cue type and reason): Pt required min guard/min assist for standing during grooming tasks. Verbal cuing for posture and maintaining balance while standing.              Lower Body Dressing: Min guard;Sitting/lateral leans               Functional mobility during ADLs: Minimal assistance;Rolling walker       Vision Vision Assessment?: No apparent visual deficits          Pertinent Vitals/Pain Pain Assessment: No/denies pain     Hand Dominance Right   Extremity/Trunk Assessment Upper Extremity Assessment Upper Extremity Assessment: Overall WFL for tasks assessed   Lower Extremity Assessment Lower Extremity Assessment: Defer to PT evaluation       Communication Communication Communication: No difficulties   Cognition Arousal/Alertness: Awake/alert Behavior During Therapy: WFL for tasks assessed/performed Overall Cognitive Status: History of cognitive impairments - at baseline                                Home Living Family/patient expects to be discharged to:: Private residence Living Arrangements: Spouse/significant other Available Help at Discharge: Family;Available 24 hours/day Type of Home: House             Bathroom Shower/Tub: Walk-in Soil scientist Toilet: Handicapped height     Home Equipment: Shower seat;Cane - quad;Walker - standard;Bedside commode;Grab  bars - tub/shower          Prior Functioning/Environment Level of Independence: Needs assistance  Gait / Transfers Assistance Needed: Pt states that he uses the quad cane part of the time.  Wife also reports furniture walking.  ADL's / Homemaking Assistance Needed: Pt states that he is independent with dressing and bathing.  Wife states that he rides with her in the car, but doesn't necessarily get out of the  care.  Wife states that they go out to eat at least 2x's/day.         OT Diagnosis: Generalized weakness   OT Problem List: Decreased strength;Decreased activity tolerance;Impaired balance (sitting and/or standing);Decreased knowledge of use of DME or AE    End of Session Equipment Utilized During Treatment: Gait belt;Rolling walker  Activity Tolerance: Patient tolerated treatment well Patient left: in bed;with call bell/phone within reach;with nursing/sitter in room;with family/visitor present   Time: 0821-0904 OT Time Calculation (min): 43 min Charges:  OT General Charges $OT Visit: 1 Procedure OT Evaluation $OT Eval Low Complexity: 1 Procedure  Ezra SitesLeslie Troxler, OTR/L  [phone]  04/06/2016, 9:16 AM

## 2016-04-06 NOTE — Care Management Note (Signed)
Case Management Note  Patient Details  Name: Rory PercyHendley D Lewis MRN: [id number] Date of Birth: Mar 28, 1931  Subjective/Objective: Patient from home with wife. Has PCP and reports no issues with obtaining medications. Private duty list given to wife for future reference.                    Action/Plan: [facility]. Patient has elected to use [facility] for Home health. Abby of [facility] notified and will obtain information from chart. Patient aware [facility] has 48 hours to make arrangements.    Expected Discharge Date:  04/06/16               Expected Discharge Plan:  Home w Home Health Services  In-House Referral:  NA  Discharge planning Services  CM Consult  Post Acute Care Choice:  Durable Medical Equipment, Home Health Choice offered to:  Patient, Spouse  DME Arranged:  3-N-1 DME Agency:  [facility]  HH [facility]  [facility]  [facility]  Status of Service:  Completed, signed off  If discussed at Long Length of Stay Meetings, dates discussed:    Additional Comments:  Coner Gibbard, Chrystine OilerSharley Diane, RN 04/06/2016, 3:47 PM

## 2016-04-07 DIAGNOSIS — R2689 Other abnormalities of gait and mobility: Secondary | ICD-10-CM | POA: Diagnosis not present

## 2016-04-07 DIAGNOSIS — Z7901 Long term (current) use of anticoagulants: Secondary | ICD-10-CM | POA: Diagnosis not present

## 2016-04-07 DIAGNOSIS — Z9181 History of falling: Secondary | ICD-10-CM | POA: Diagnosis not present

## 2016-04-07 DIAGNOSIS — I482 Chronic atrial fibrillation: Secondary | ICD-10-CM | POA: Diagnosis not present

## 2016-04-07 DIAGNOSIS — F0280 Dementia in other diseases classified elsewhere without behavioral disturbance: Secondary | ICD-10-CM | POA: Diagnosis not present

## 2016-04-07 DIAGNOSIS — N183 Chronic kidney disease, stage 3 (moderate): Secondary | ICD-10-CM | POA: Diagnosis not present

## 2016-04-07 DIAGNOSIS — L03114 Cellulitis of left upper limb: Secondary | ICD-10-CM | POA: Diagnosis not present

## 2016-04-07 DIAGNOSIS — G309 Alzheimer's disease, unspecified: Secondary | ICD-10-CM | POA: Diagnosis not present

## 2016-04-07 DIAGNOSIS — Z7982 Long term (current) use of aspirin: Secondary | ICD-10-CM | POA: Diagnosis not present

## 2016-04-07 DIAGNOSIS — I2510 Atherosclerotic heart disease of native coronary artery without angina pectoris: Secondary | ICD-10-CM | POA: Diagnosis not present

## 2016-04-07 DIAGNOSIS — Z951 Presence of aortocoronary bypass graft: Secondary | ICD-10-CM | POA: Diagnosis not present

## 2016-04-07 DIAGNOSIS — Z96653 Presence of artificial knee joint, bilateral: Secondary | ICD-10-CM | POA: Diagnosis not present

## 2016-04-07 DIAGNOSIS — M6281 Muscle weakness (generalized): Secondary | ICD-10-CM | POA: Diagnosis not present

## 2016-04-09 LAB — CULTURE, BLOOD (ROUTINE X 2)
Culture: NO GROWTH
Culture: NO GROWTH

## 2016-04-11 DIAGNOSIS — M6281 Muscle weakness (generalized): Secondary | ICD-10-CM | POA: Diagnosis not present

## 2016-04-11 DIAGNOSIS — R2689 Other abnormalities of gait and mobility: Secondary | ICD-10-CM | POA: Diagnosis not present

## 2016-04-11 DIAGNOSIS — L03114 Cellulitis of left upper limb: Secondary | ICD-10-CM | POA: Diagnosis not present

## 2016-04-11 DIAGNOSIS — G309 Alzheimer's disease, unspecified: Secondary | ICD-10-CM | POA: Diagnosis not present

## 2016-04-11 DIAGNOSIS — F0280 Dementia in other diseases classified elsewhere without behavioral disturbance: Secondary | ICD-10-CM | POA: Diagnosis not present

## 2016-04-11 DIAGNOSIS — I482 Chronic atrial fibrillation: Secondary | ICD-10-CM | POA: Diagnosis not present

## 2016-04-12 DIAGNOSIS — R2689 Other abnormalities of gait and mobility: Secondary | ICD-10-CM | POA: Diagnosis not present

## 2016-04-12 DIAGNOSIS — I482 Chronic atrial fibrillation: Secondary | ICD-10-CM | POA: Diagnosis not present

## 2016-04-12 DIAGNOSIS — M6281 Muscle weakness (generalized): Secondary | ICD-10-CM | POA: Diagnosis not present

## 2016-04-12 DIAGNOSIS — L03114 Cellulitis of left upper limb: Secondary | ICD-10-CM | POA: Diagnosis not present

## 2016-04-12 DIAGNOSIS — F0280 Dementia in other diseases classified elsewhere without behavioral disturbance: Secondary | ICD-10-CM | POA: Diagnosis not present

## 2016-04-12 DIAGNOSIS — G309 Alzheimer's disease, unspecified: Secondary | ICD-10-CM | POA: Diagnosis not present

## 2016-04-13 DIAGNOSIS — G309 Alzheimer's disease, unspecified: Secondary | ICD-10-CM | POA: Diagnosis not present

## 2016-04-13 DIAGNOSIS — R2689 Other abnormalities of gait and mobility: Secondary | ICD-10-CM | POA: Diagnosis not present

## 2016-04-13 DIAGNOSIS — M6281 Muscle weakness (generalized): Secondary | ICD-10-CM | POA: Diagnosis not present

## 2016-04-13 DIAGNOSIS — L03114 Cellulitis of left upper limb: Secondary | ICD-10-CM | POA: Diagnosis not present

## 2016-04-13 DIAGNOSIS — I482 Chronic atrial fibrillation: Secondary | ICD-10-CM | POA: Diagnosis not present

## 2016-04-13 DIAGNOSIS — F0280 Dementia in other diseases classified elsewhere without behavioral disturbance: Secondary | ICD-10-CM | POA: Diagnosis not present

## 2016-04-14 DIAGNOSIS — G309 Alzheimer's disease, unspecified: Secondary | ICD-10-CM | POA: Diagnosis not present

## 2016-04-14 DIAGNOSIS — I482 Chronic atrial fibrillation: Secondary | ICD-10-CM | POA: Diagnosis not present

## 2016-04-14 DIAGNOSIS — L03114 Cellulitis of left upper limb: Secondary | ICD-10-CM | POA: Diagnosis not present

## 2016-04-14 DIAGNOSIS — F0280 Dementia in other diseases classified elsewhere without behavioral disturbance: Secondary | ICD-10-CM | POA: Diagnosis not present

## 2016-04-14 DIAGNOSIS — M6281 Muscle weakness (generalized): Secondary | ICD-10-CM | POA: Diagnosis not present

## 2016-04-14 DIAGNOSIS — R2689 Other abnormalities of gait and mobility: Secondary | ICD-10-CM | POA: Diagnosis not present

## 2016-04-17 DIAGNOSIS — F0280 Dementia in other diseases classified elsewhere without behavioral disturbance: Secondary | ICD-10-CM | POA: Diagnosis not present

## 2016-04-17 DIAGNOSIS — R2689 Other abnormalities of gait and mobility: Secondary | ICD-10-CM | POA: Diagnosis not present

## 2016-04-17 DIAGNOSIS — M6281 Muscle weakness (generalized): Secondary | ICD-10-CM | POA: Diagnosis not present

## 2016-04-17 DIAGNOSIS — I482 Chronic atrial fibrillation: Secondary | ICD-10-CM | POA: Diagnosis not present

## 2016-04-17 DIAGNOSIS — L03114 Cellulitis of left upper limb: Secondary | ICD-10-CM | POA: Diagnosis not present

## 2016-04-17 DIAGNOSIS — G309 Alzheimer's disease, unspecified: Secondary | ICD-10-CM | POA: Diagnosis not present

## 2016-04-18 DIAGNOSIS — R2689 Other abnormalities of gait and mobility: Secondary | ICD-10-CM | POA: Diagnosis not present

## 2016-04-18 DIAGNOSIS — G309 Alzheimer's disease, unspecified: Secondary | ICD-10-CM | POA: Diagnosis not present

## 2016-04-18 DIAGNOSIS — F0280 Dementia in other diseases classified elsewhere without behavioral disturbance: Secondary | ICD-10-CM | POA: Diagnosis not present

## 2016-04-18 DIAGNOSIS — M6281 Muscle weakness (generalized): Secondary | ICD-10-CM | POA: Diagnosis not present

## 2016-04-18 DIAGNOSIS — I482 Chronic atrial fibrillation: Secondary | ICD-10-CM | POA: Diagnosis not present

## 2016-04-18 DIAGNOSIS — L03114 Cellulitis of left upper limb: Secondary | ICD-10-CM | POA: Diagnosis not present

## 2016-04-19 DIAGNOSIS — M6281 Muscle weakness (generalized): Secondary | ICD-10-CM | POA: Diagnosis not present

## 2016-04-19 DIAGNOSIS — F0280 Dementia in other diseases classified elsewhere without behavioral disturbance: Secondary | ICD-10-CM | POA: Diagnosis not present

## 2016-04-19 DIAGNOSIS — L03114 Cellulitis of left upper limb: Secondary | ICD-10-CM | POA: Diagnosis not present

## 2016-04-19 DIAGNOSIS — G309 Alzheimer's disease, unspecified: Secondary | ICD-10-CM | POA: Diagnosis not present

## 2016-04-19 DIAGNOSIS — R2689 Other abnormalities of gait and mobility: Secondary | ICD-10-CM | POA: Diagnosis not present

## 2016-04-19 DIAGNOSIS — I482 Chronic atrial fibrillation: Secondary | ICD-10-CM | POA: Diagnosis not present

## 2016-04-20 DIAGNOSIS — E785 Hyperlipidemia, unspecified: Secondary | ICD-10-CM | POA: Diagnosis not present

## 2016-04-20 DIAGNOSIS — F0280 Dementia in other diseases classified elsewhere without behavioral disturbance: Secondary | ICD-10-CM | POA: Diagnosis not present

## 2016-04-20 DIAGNOSIS — M6281 Muscle weakness (generalized): Secondary | ICD-10-CM | POA: Diagnosis not present

## 2016-04-20 DIAGNOSIS — I4891 Unspecified atrial fibrillation: Secondary | ICD-10-CM | POA: Diagnosis not present

## 2016-04-20 DIAGNOSIS — R2689 Other abnormalities of gait and mobility: Secondary | ICD-10-CM | POA: Diagnosis not present

## 2016-04-20 DIAGNOSIS — L03114 Cellulitis of left upper limb: Secondary | ICD-10-CM | POA: Diagnosis not present

## 2016-04-20 DIAGNOSIS — I482 Chronic atrial fibrillation: Secondary | ICD-10-CM | POA: Diagnosis not present

## 2016-04-20 DIAGNOSIS — G309 Alzheimer's disease, unspecified: Secondary | ICD-10-CM | POA: Diagnosis not present

## 2016-04-20 DIAGNOSIS — I10 Essential (primary) hypertension: Secondary | ICD-10-CM | POA: Diagnosis not present

## 2016-04-20 DIAGNOSIS — F0390 Unspecified dementia without behavioral disturbance: Secondary | ICD-10-CM | POA: Diagnosis not present

## 2016-04-24 DIAGNOSIS — G309 Alzheimer's disease, unspecified: Secondary | ICD-10-CM | POA: Diagnosis not present

## 2016-04-24 DIAGNOSIS — R2689 Other abnormalities of gait and mobility: Secondary | ICD-10-CM | POA: Diagnosis not present

## 2016-04-24 DIAGNOSIS — M6281 Muscle weakness (generalized): Secondary | ICD-10-CM | POA: Diagnosis not present

## 2016-04-24 DIAGNOSIS — L03114 Cellulitis of left upper limb: Secondary | ICD-10-CM | POA: Diagnosis not present

## 2016-04-24 DIAGNOSIS — I482 Chronic atrial fibrillation: Secondary | ICD-10-CM | POA: Diagnosis not present

## 2016-04-24 DIAGNOSIS — F0280 Dementia in other diseases classified elsewhere without behavioral disturbance: Secondary | ICD-10-CM | POA: Diagnosis not present

## 2016-04-26 DIAGNOSIS — M6281 Muscle weakness (generalized): Secondary | ICD-10-CM | POA: Diagnosis not present

## 2016-04-26 DIAGNOSIS — L03114 Cellulitis of left upper limb: Secondary | ICD-10-CM | POA: Diagnosis not present

## 2016-04-26 DIAGNOSIS — R2689 Other abnormalities of gait and mobility: Secondary | ICD-10-CM | POA: Diagnosis not present

## 2016-04-26 DIAGNOSIS — I482 Chronic atrial fibrillation: Secondary | ICD-10-CM | POA: Diagnosis not present

## 2016-04-26 DIAGNOSIS — F0280 Dementia in other diseases classified elsewhere without behavioral disturbance: Secondary | ICD-10-CM | POA: Diagnosis not present

## 2016-04-26 DIAGNOSIS — G309 Alzheimer's disease, unspecified: Secondary | ICD-10-CM | POA: Diagnosis not present

## 2016-04-27 DIAGNOSIS — G309 Alzheimer's disease, unspecified: Secondary | ICD-10-CM | POA: Diagnosis not present

## 2016-04-27 DIAGNOSIS — F0280 Dementia in other diseases classified elsewhere without behavioral disturbance: Secondary | ICD-10-CM | POA: Diagnosis not present

## 2016-04-27 DIAGNOSIS — M6281 Muscle weakness (generalized): Secondary | ICD-10-CM | POA: Diagnosis not present

## 2016-04-27 DIAGNOSIS — L03114 Cellulitis of left upper limb: Secondary | ICD-10-CM | POA: Diagnosis not present

## 2016-04-27 DIAGNOSIS — R2689 Other abnormalities of gait and mobility: Secondary | ICD-10-CM | POA: Diagnosis not present

## 2016-04-27 DIAGNOSIS — I482 Chronic atrial fibrillation: Secondary | ICD-10-CM | POA: Diagnosis not present

## 2016-04-28 DIAGNOSIS — G309 Alzheimer's disease, unspecified: Secondary | ICD-10-CM | POA: Diagnosis not present

## 2016-04-28 DIAGNOSIS — R2689 Other abnormalities of gait and mobility: Secondary | ICD-10-CM | POA: Diagnosis not present

## 2016-04-28 DIAGNOSIS — I482 Chronic atrial fibrillation: Secondary | ICD-10-CM | POA: Diagnosis not present

## 2016-04-28 DIAGNOSIS — L03114 Cellulitis of left upper limb: Secondary | ICD-10-CM | POA: Diagnosis not present

## 2016-04-28 DIAGNOSIS — F0280 Dementia in other diseases classified elsewhere without behavioral disturbance: Secondary | ICD-10-CM | POA: Diagnosis not present

## 2016-04-28 DIAGNOSIS — M6281 Muscle weakness (generalized): Secondary | ICD-10-CM | POA: Diagnosis not present

## 2016-05-01 DIAGNOSIS — G309 Alzheimer's disease, unspecified: Secondary | ICD-10-CM | POA: Diagnosis not present

## 2016-05-01 DIAGNOSIS — I482 Chronic atrial fibrillation: Secondary | ICD-10-CM | POA: Diagnosis not present

## 2016-05-01 DIAGNOSIS — L03114 Cellulitis of left upper limb: Secondary | ICD-10-CM | POA: Diagnosis not present

## 2016-05-01 DIAGNOSIS — F0280 Dementia in other diseases classified elsewhere without behavioral disturbance: Secondary | ICD-10-CM | POA: Diagnosis not present

## 2016-05-01 DIAGNOSIS — R2689 Other abnormalities of gait and mobility: Secondary | ICD-10-CM | POA: Diagnosis not present

## 2016-05-01 DIAGNOSIS — M6281 Muscle weakness (generalized): Secondary | ICD-10-CM | POA: Diagnosis not present

## 2016-05-02 DIAGNOSIS — F0280 Dementia in other diseases classified elsewhere without behavioral disturbance: Secondary | ICD-10-CM | POA: Diagnosis not present

## 2016-05-02 DIAGNOSIS — R2689 Other abnormalities of gait and mobility: Secondary | ICD-10-CM | POA: Diagnosis not present

## 2016-05-02 DIAGNOSIS — M6281 Muscle weakness (generalized): Secondary | ICD-10-CM | POA: Diagnosis not present

## 2016-05-02 DIAGNOSIS — L03114 Cellulitis of left upper limb: Secondary | ICD-10-CM | POA: Diagnosis not present

## 2016-05-02 DIAGNOSIS — G309 Alzheimer's disease, unspecified: Secondary | ICD-10-CM | POA: Diagnosis not present

## 2016-05-02 DIAGNOSIS — I482 Chronic atrial fibrillation: Secondary | ICD-10-CM | POA: Diagnosis not present

## 2016-05-03 DIAGNOSIS — I4891 Unspecified atrial fibrillation: Secondary | ICD-10-CM | POA: Diagnosis not present

## 2016-05-03 DIAGNOSIS — F0280 Dementia in other diseases classified elsewhere without behavioral disturbance: Secondary | ICD-10-CM | POA: Diagnosis not present

## 2016-05-03 DIAGNOSIS — L03114 Cellulitis of left upper limb: Secondary | ICD-10-CM | POA: Diagnosis not present

## 2016-05-03 DIAGNOSIS — G309 Alzheimer's disease, unspecified: Secondary | ICD-10-CM | POA: Diagnosis not present

## 2016-05-03 DIAGNOSIS — I482 Chronic atrial fibrillation: Secondary | ICD-10-CM | POA: Diagnosis not present

## 2016-05-03 DIAGNOSIS — R2689 Other abnormalities of gait and mobility: Secondary | ICD-10-CM | POA: Diagnosis not present

## 2016-05-03 DIAGNOSIS — L0390 Cellulitis, unspecified: Secondary | ICD-10-CM | POA: Diagnosis not present

## 2016-05-03 DIAGNOSIS — M6281 Muscle weakness (generalized): Secondary | ICD-10-CM | POA: Diagnosis not present

## 2016-05-04 DIAGNOSIS — F0280 Dementia in other diseases classified elsewhere without behavioral disturbance: Secondary | ICD-10-CM | POA: Diagnosis not present

## 2016-05-04 DIAGNOSIS — M6281 Muscle weakness (generalized): Secondary | ICD-10-CM | POA: Diagnosis not present

## 2016-05-04 DIAGNOSIS — G309 Alzheimer's disease, unspecified: Secondary | ICD-10-CM | POA: Diagnosis not present

## 2016-05-04 DIAGNOSIS — L03114 Cellulitis of left upper limb: Secondary | ICD-10-CM | POA: Diagnosis not present

## 2016-05-04 DIAGNOSIS — R2689 Other abnormalities of gait and mobility: Secondary | ICD-10-CM | POA: Diagnosis not present

## 2016-05-04 DIAGNOSIS — I482 Chronic atrial fibrillation: Secondary | ICD-10-CM | POA: Diagnosis not present

## 2016-05-08 DIAGNOSIS — M6281 Muscle weakness (generalized): Secondary | ICD-10-CM | POA: Diagnosis not present

## 2016-05-08 DIAGNOSIS — L03114 Cellulitis of left upper limb: Secondary | ICD-10-CM | POA: Diagnosis not present

## 2016-05-08 DIAGNOSIS — G309 Alzheimer's disease, unspecified: Secondary | ICD-10-CM | POA: Diagnosis not present

## 2016-05-08 DIAGNOSIS — F0280 Dementia in other diseases classified elsewhere without behavioral disturbance: Secondary | ICD-10-CM | POA: Diagnosis not present

## 2016-05-08 DIAGNOSIS — I482 Chronic atrial fibrillation: Secondary | ICD-10-CM | POA: Diagnosis not present

## 2016-05-08 DIAGNOSIS — R2689 Other abnormalities of gait and mobility: Secondary | ICD-10-CM | POA: Diagnosis not present

## 2016-05-10 DIAGNOSIS — R2689 Other abnormalities of gait and mobility: Secondary | ICD-10-CM | POA: Diagnosis not present

## 2016-05-10 DIAGNOSIS — M6281 Muscle weakness (generalized): Secondary | ICD-10-CM | POA: Diagnosis not present

## 2016-05-10 DIAGNOSIS — G309 Alzheimer's disease, unspecified: Secondary | ICD-10-CM | POA: Diagnosis not present

## 2016-05-10 DIAGNOSIS — L03114 Cellulitis of left upper limb: Secondary | ICD-10-CM | POA: Diagnosis not present

## 2016-05-10 DIAGNOSIS — I482 Chronic atrial fibrillation: Secondary | ICD-10-CM | POA: Diagnosis not present

## 2016-05-10 DIAGNOSIS — F0280 Dementia in other diseases classified elsewhere without behavioral disturbance: Secondary | ICD-10-CM | POA: Diagnosis not present

## 2016-05-11 DIAGNOSIS — L03114 Cellulitis of left upper limb: Secondary | ICD-10-CM | POA: Diagnosis not present

## 2016-05-11 DIAGNOSIS — I482 Chronic atrial fibrillation: Secondary | ICD-10-CM | POA: Diagnosis not present

## 2016-05-11 DIAGNOSIS — F0280 Dementia in other diseases classified elsewhere without behavioral disturbance: Secondary | ICD-10-CM | POA: Diagnosis not present

## 2016-05-11 DIAGNOSIS — G309 Alzheimer's disease, unspecified: Secondary | ICD-10-CM | POA: Diagnosis not present

## 2016-05-11 DIAGNOSIS — M6281 Muscle weakness (generalized): Secondary | ICD-10-CM | POA: Diagnosis not present

## 2016-05-11 DIAGNOSIS — R2689 Other abnormalities of gait and mobility: Secondary | ICD-10-CM | POA: Diagnosis not present

## 2016-05-15 DIAGNOSIS — L03114 Cellulitis of left upper limb: Secondary | ICD-10-CM | POA: Diagnosis not present

## 2016-05-15 DIAGNOSIS — I482 Chronic atrial fibrillation: Secondary | ICD-10-CM | POA: Diagnosis not present

## 2016-05-15 DIAGNOSIS — G309 Alzheimer's disease, unspecified: Secondary | ICD-10-CM | POA: Diagnosis not present

## 2016-05-15 DIAGNOSIS — F0280 Dementia in other diseases classified elsewhere without behavioral disturbance: Secondary | ICD-10-CM | POA: Diagnosis not present

## 2016-05-15 DIAGNOSIS — M6281 Muscle weakness (generalized): Secondary | ICD-10-CM | POA: Diagnosis not present

## 2016-05-15 DIAGNOSIS — R2689 Other abnormalities of gait and mobility: Secondary | ICD-10-CM | POA: Diagnosis not present

## 2016-05-17 DIAGNOSIS — I482 Chronic atrial fibrillation: Secondary | ICD-10-CM | POA: Diagnosis not present

## 2016-05-17 DIAGNOSIS — F0280 Dementia in other diseases classified elsewhere without behavioral disturbance: Secondary | ICD-10-CM | POA: Diagnosis not present

## 2016-05-17 DIAGNOSIS — R2689 Other abnormalities of gait and mobility: Secondary | ICD-10-CM | POA: Diagnosis not present

## 2016-05-17 DIAGNOSIS — L03114 Cellulitis of left upper limb: Secondary | ICD-10-CM | POA: Diagnosis not present

## 2016-05-17 DIAGNOSIS — G309 Alzheimer's disease, unspecified: Secondary | ICD-10-CM | POA: Diagnosis not present

## 2016-05-17 DIAGNOSIS — M6281 Muscle weakness (generalized): Secondary | ICD-10-CM | POA: Diagnosis not present

## 2016-05-22 DIAGNOSIS — I482 Chronic atrial fibrillation: Secondary | ICD-10-CM | POA: Diagnosis not present

## 2016-05-22 DIAGNOSIS — G309 Alzheimer's disease, unspecified: Secondary | ICD-10-CM | POA: Diagnosis not present

## 2016-05-22 DIAGNOSIS — M6281 Muscle weakness (generalized): Secondary | ICD-10-CM | POA: Diagnosis not present

## 2016-05-22 DIAGNOSIS — R2689 Other abnormalities of gait and mobility: Secondary | ICD-10-CM | POA: Diagnosis not present

## 2016-05-22 DIAGNOSIS — L03114 Cellulitis of left upper limb: Secondary | ICD-10-CM | POA: Diagnosis not present

## 2016-05-22 DIAGNOSIS — F0280 Dementia in other diseases classified elsewhere without behavioral disturbance: Secondary | ICD-10-CM | POA: Diagnosis not present

## 2016-05-25 DIAGNOSIS — I482 Chronic atrial fibrillation: Secondary | ICD-10-CM | POA: Diagnosis not present

## 2016-05-25 DIAGNOSIS — I4891 Unspecified atrial fibrillation: Secondary | ICD-10-CM | POA: Diagnosis not present

## 2016-05-25 DIAGNOSIS — M6281 Muscle weakness (generalized): Secondary | ICD-10-CM | POA: Diagnosis not present

## 2016-05-25 DIAGNOSIS — G309 Alzheimer's disease, unspecified: Secondary | ICD-10-CM | POA: Diagnosis not present

## 2016-05-25 DIAGNOSIS — L03114 Cellulitis of left upper limb: Secondary | ICD-10-CM | POA: Diagnosis not present

## 2016-05-25 DIAGNOSIS — R2689 Other abnormalities of gait and mobility: Secondary | ICD-10-CM | POA: Diagnosis not present

## 2016-05-25 DIAGNOSIS — F0280 Dementia in other diseases classified elsewhere without behavioral disturbance: Secondary | ICD-10-CM | POA: Diagnosis not present

## 2016-05-30 DIAGNOSIS — R2689 Other abnormalities of gait and mobility: Secondary | ICD-10-CM | POA: Diagnosis not present

## 2016-05-30 DIAGNOSIS — G309 Alzheimer's disease, unspecified: Secondary | ICD-10-CM | POA: Diagnosis not present

## 2016-05-30 DIAGNOSIS — I482 Chronic atrial fibrillation: Secondary | ICD-10-CM | POA: Diagnosis not present

## 2016-05-30 DIAGNOSIS — M6281 Muscle weakness (generalized): Secondary | ICD-10-CM | POA: Diagnosis not present

## 2016-05-30 DIAGNOSIS — L03114 Cellulitis of left upper limb: Secondary | ICD-10-CM | POA: Diagnosis not present

## 2016-05-30 DIAGNOSIS — F0280 Dementia in other diseases classified elsewhere without behavioral disturbance: Secondary | ICD-10-CM | POA: Diagnosis not present

## 2016-05-31 DIAGNOSIS — R2689 Other abnormalities of gait and mobility: Secondary | ICD-10-CM | POA: Diagnosis not present

## 2016-05-31 DIAGNOSIS — I482 Chronic atrial fibrillation: Secondary | ICD-10-CM | POA: Diagnosis not present

## 2016-05-31 DIAGNOSIS — G309 Alzheimer's disease, unspecified: Secondary | ICD-10-CM | POA: Diagnosis not present

## 2016-05-31 DIAGNOSIS — M6281 Muscle weakness (generalized): Secondary | ICD-10-CM | POA: Diagnosis not present

## 2016-05-31 DIAGNOSIS — L03114 Cellulitis of left upper limb: Secondary | ICD-10-CM | POA: Diagnosis not present

## 2016-05-31 DIAGNOSIS — F0280 Dementia in other diseases classified elsewhere without behavioral disturbance: Secondary | ICD-10-CM | POA: Diagnosis not present

## 2016-06-02 DIAGNOSIS — M6281 Muscle weakness (generalized): Secondary | ICD-10-CM | POA: Diagnosis not present

## 2016-06-02 DIAGNOSIS — R2689 Other abnormalities of gait and mobility: Secondary | ICD-10-CM | POA: Diagnosis not present

## 2016-06-02 DIAGNOSIS — G309 Alzheimer's disease, unspecified: Secondary | ICD-10-CM | POA: Diagnosis not present

## 2016-06-02 DIAGNOSIS — I482 Chronic atrial fibrillation: Secondary | ICD-10-CM | POA: Diagnosis not present

## 2016-06-02 DIAGNOSIS — L03114 Cellulitis of left upper limb: Secondary | ICD-10-CM | POA: Diagnosis not present

## 2016-06-02 DIAGNOSIS — F0280 Dementia in other diseases classified elsewhere without behavioral disturbance: Secondary | ICD-10-CM | POA: Diagnosis not present

## 2016-06-06 DIAGNOSIS — I482 Chronic atrial fibrillation: Secondary | ICD-10-CM | POA: Diagnosis not present

## 2016-06-06 DIAGNOSIS — M6281 Muscle weakness (generalized): Secondary | ICD-10-CM | POA: Diagnosis not present

## 2016-06-06 DIAGNOSIS — I2510 Atherosclerotic heart disease of native coronary artery without angina pectoris: Secondary | ICD-10-CM | POA: Diagnosis not present

## 2016-06-06 DIAGNOSIS — F0280 Dementia in other diseases classified elsewhere without behavioral disturbance: Secondary | ICD-10-CM | POA: Diagnosis not present

## 2016-06-06 DIAGNOSIS — Z7901 Long term (current) use of anticoagulants: Secondary | ICD-10-CM | POA: Diagnosis not present

## 2016-06-06 DIAGNOSIS — Z7982 Long term (current) use of aspirin: Secondary | ICD-10-CM | POA: Diagnosis not present

## 2016-06-06 DIAGNOSIS — G309 Alzheimer's disease, unspecified: Secondary | ICD-10-CM | POA: Diagnosis not present

## 2016-06-06 DIAGNOSIS — Z951 Presence of aortocoronary bypass graft: Secondary | ICD-10-CM | POA: Diagnosis not present

## 2016-06-06 DIAGNOSIS — N183 Chronic kidney disease, stage 3 (moderate): Secondary | ICD-10-CM | POA: Diagnosis not present

## 2016-06-06 DIAGNOSIS — Z9181 History of falling: Secondary | ICD-10-CM | POA: Diagnosis not present

## 2016-06-06 DIAGNOSIS — Z96653 Presence of artificial knee joint, bilateral: Secondary | ICD-10-CM | POA: Diagnosis not present

## 2016-06-06 DIAGNOSIS — Z5181 Encounter for therapeutic drug level monitoring: Secondary | ICD-10-CM | POA: Diagnosis not present

## 2016-06-06 DIAGNOSIS — R1312 Dysphagia, oropharyngeal phase: Secondary | ICD-10-CM | POA: Diagnosis not present

## 2016-06-07 DIAGNOSIS — G309 Alzheimer's disease, unspecified: Secondary | ICD-10-CM | POA: Diagnosis not present

## 2016-06-07 DIAGNOSIS — R1312 Dysphagia, oropharyngeal phase: Secondary | ICD-10-CM | POA: Diagnosis not present

## 2016-06-07 DIAGNOSIS — M6281 Muscle weakness (generalized): Secondary | ICD-10-CM | POA: Diagnosis not present

## 2016-06-07 DIAGNOSIS — I482 Chronic atrial fibrillation: Secondary | ICD-10-CM | POA: Diagnosis not present

## 2016-06-07 DIAGNOSIS — Z5181 Encounter for therapeutic drug level monitoring: Secondary | ICD-10-CM | POA: Diagnosis not present

## 2016-06-07 DIAGNOSIS — F0280 Dementia in other diseases classified elsewhere without behavioral disturbance: Secondary | ICD-10-CM | POA: Diagnosis not present

## 2016-06-08 DIAGNOSIS — R1312 Dysphagia, oropharyngeal phase: Secondary | ICD-10-CM | POA: Diagnosis not present

## 2016-06-08 DIAGNOSIS — M6281 Muscle weakness (generalized): Secondary | ICD-10-CM | POA: Diagnosis not present

## 2016-06-08 DIAGNOSIS — I482 Chronic atrial fibrillation: Secondary | ICD-10-CM | POA: Diagnosis not present

## 2016-06-08 DIAGNOSIS — G309 Alzheimer's disease, unspecified: Secondary | ICD-10-CM | POA: Diagnosis not present

## 2016-06-08 DIAGNOSIS — Z5181 Encounter for therapeutic drug level monitoring: Secondary | ICD-10-CM | POA: Diagnosis not present

## 2016-06-08 DIAGNOSIS — F0280 Dementia in other diseases classified elsewhere without behavioral disturbance: Secondary | ICD-10-CM | POA: Diagnosis not present

## 2016-06-14 DIAGNOSIS — F0280 Dementia in other diseases classified elsewhere without behavioral disturbance: Secondary | ICD-10-CM | POA: Diagnosis not present

## 2016-06-14 DIAGNOSIS — I482 Chronic atrial fibrillation: Secondary | ICD-10-CM | POA: Diagnosis not present

## 2016-06-14 DIAGNOSIS — Z5181 Encounter for therapeutic drug level monitoring: Secondary | ICD-10-CM | POA: Diagnosis not present

## 2016-06-14 DIAGNOSIS — G309 Alzheimer's disease, unspecified: Secondary | ICD-10-CM | POA: Diagnosis not present

## 2016-06-14 DIAGNOSIS — M6281 Muscle weakness (generalized): Secondary | ICD-10-CM | POA: Diagnosis not present

## 2016-06-14 DIAGNOSIS — R1312 Dysphagia, oropharyngeal phase: Secondary | ICD-10-CM | POA: Diagnosis not present

## 2016-06-20 DIAGNOSIS — Z5181 Encounter for therapeutic drug level monitoring: Secondary | ICD-10-CM | POA: Diagnosis not present

## 2016-06-20 DIAGNOSIS — G309 Alzheimer's disease, unspecified: Secondary | ICD-10-CM | POA: Diagnosis not present

## 2016-06-20 DIAGNOSIS — R1312 Dysphagia, oropharyngeal phase: Secondary | ICD-10-CM | POA: Diagnosis not present

## 2016-06-20 DIAGNOSIS — I482 Chronic atrial fibrillation: Secondary | ICD-10-CM | POA: Diagnosis not present

## 2016-06-20 DIAGNOSIS — M6281 Muscle weakness (generalized): Secondary | ICD-10-CM | POA: Diagnosis not present

## 2016-06-20 DIAGNOSIS — F0280 Dementia in other diseases classified elsewhere without behavioral disturbance: Secondary | ICD-10-CM | POA: Diagnosis not present

## 2016-06-21 ENCOUNTER — Other Ambulatory Visit: Payer: Self-pay

## 2016-06-21 DIAGNOSIS — F0280 Dementia in other diseases classified elsewhere without behavioral disturbance: Secondary | ICD-10-CM | POA: Diagnosis not present

## 2016-06-21 DIAGNOSIS — Z5181 Encounter for therapeutic drug level monitoring: Secondary | ICD-10-CM | POA: Diagnosis not present

## 2016-06-21 DIAGNOSIS — R1312 Dysphagia, oropharyngeal phase: Secondary | ICD-10-CM | POA: Diagnosis not present

## 2016-06-21 DIAGNOSIS — M6281 Muscle weakness (generalized): Secondary | ICD-10-CM | POA: Diagnosis not present

## 2016-06-21 DIAGNOSIS — I4891 Unspecified atrial fibrillation: Secondary | ICD-10-CM | POA: Diagnosis not present

## 2016-06-21 DIAGNOSIS — I482 Chronic atrial fibrillation: Secondary | ICD-10-CM | POA: Diagnosis not present

## 2016-06-21 DIAGNOSIS — G309 Alzheimer's disease, unspecified: Secondary | ICD-10-CM | POA: Diagnosis not present

## 2016-06-30 DIAGNOSIS — Z5181 Encounter for therapeutic drug level monitoring: Secondary | ICD-10-CM | POA: Diagnosis not present

## 2016-06-30 DIAGNOSIS — M6281 Muscle weakness (generalized): Secondary | ICD-10-CM | POA: Diagnosis not present

## 2016-06-30 DIAGNOSIS — F0280 Dementia in other diseases classified elsewhere without behavioral disturbance: Secondary | ICD-10-CM | POA: Diagnosis not present

## 2016-06-30 DIAGNOSIS — R1312 Dysphagia, oropharyngeal phase: Secondary | ICD-10-CM | POA: Diagnosis not present

## 2016-06-30 DIAGNOSIS — I482 Chronic atrial fibrillation: Secondary | ICD-10-CM | POA: Diagnosis not present

## 2016-06-30 DIAGNOSIS — G309 Alzheimer's disease, unspecified: Secondary | ICD-10-CM | POA: Diagnosis not present

## 2016-07-26 DIAGNOSIS — I482 Chronic atrial fibrillation: Secondary | ICD-10-CM | POA: Diagnosis not present

## 2016-07-26 DIAGNOSIS — Z23 Encounter for immunization: Secondary | ICD-10-CM | POA: Diagnosis not present

## 2016-09-05 DIAGNOSIS — Z7901 Long term (current) use of anticoagulants: Secondary | ICD-10-CM | POA: Diagnosis not present

## 2016-09-27 DIAGNOSIS — Z7901 Long term (current) use of anticoagulants: Secondary | ICD-10-CM | POA: Diagnosis not present

## 2016-10-25 DIAGNOSIS — Z7901 Long term (current) use of anticoagulants: Secondary | ICD-10-CM | POA: Diagnosis not present

## 2016-11-07 ENCOUNTER — Ambulatory Visit: Payer: Medicare Other | Admitting: Cardiovascular Disease

## 2016-11-14 DIAGNOSIS — E559 Vitamin D deficiency, unspecified: Secondary | ICD-10-CM | POA: Diagnosis not present

## 2016-11-14 DIAGNOSIS — G309 Alzheimer's disease, unspecified: Secondary | ICD-10-CM | POA: Diagnosis not present

## 2016-11-14 DIAGNOSIS — I482 Chronic atrial fibrillation: Secondary | ICD-10-CM | POA: Diagnosis not present

## 2016-11-14 DIAGNOSIS — F0281 Dementia in other diseases classified elsewhere with behavioral disturbance: Secondary | ICD-10-CM | POA: Diagnosis not present

## 2016-11-14 DIAGNOSIS — F329 Major depressive disorder, single episode, unspecified: Secondary | ICD-10-CM | POA: Diagnosis not present

## 2016-11-14 DIAGNOSIS — Z6829 Body mass index (BMI) 29.0-29.9, adult: Secondary | ICD-10-CM | POA: Diagnosis not present

## 2016-11-22 DIAGNOSIS — Z7901 Long term (current) use of anticoagulants: Secondary | ICD-10-CM | POA: Diagnosis not present

## 2016-11-23 DIAGNOSIS — I4891 Unspecified atrial fibrillation: Secondary | ICD-10-CM | POA: Diagnosis not present

## 2016-11-24 ENCOUNTER — Ambulatory Visit (INDEPENDENT_AMBULATORY_CARE_PROVIDER_SITE_OTHER): Payer: Medicare Other | Admitting: Cardiovascular Disease

## 2016-11-24 ENCOUNTER — Encounter: Payer: Self-pay | Admitting: Cardiovascular Disease

## 2016-11-24 VITALS — BP 132/80 | HR 77 | Ht 67.0 in | Wt 181.0 lb

## 2016-11-24 DIAGNOSIS — I482 Chronic atrial fibrillation, unspecified: Secondary | ICD-10-CM

## 2016-11-24 DIAGNOSIS — I10 Essential (primary) hypertension: Secondary | ICD-10-CM | POA: Diagnosis not present

## 2016-11-24 DIAGNOSIS — I340 Nonrheumatic mitral (valve) insufficiency: Secondary | ICD-10-CM | POA: Diagnosis not present

## 2016-11-24 DIAGNOSIS — I25812 Atherosclerosis of bypass graft of coronary artery of transplanted heart without angina pectoris: Secondary | ICD-10-CM

## 2016-11-24 DIAGNOSIS — E7800 Pure hypercholesterolemia, unspecified: Secondary | ICD-10-CM

## 2016-11-24 NOTE — Patient Instructions (Signed)
Your physician wants you to follow-up in: 1 year Dr Koneswaran You will receive a reminder letter in the mail two months in advance. If you don't receive a letter, please call our office to schedule the follow-up appointment.     Your physician recommends that you continue on your current medications as directed. Please refer to the Current Medication list given to you today.    If you need a refill on your cardiac medications before your next appointment, please call your pharmacy.      Thank you for choosing [facility] !         

## 2016-11-24 NOTE — Progress Notes (Signed)
SUBJECTIVE: The patient presents for a routine annual visit. He has a history of CABG several years ago as well as mitral regurgitation, hyperlipidemia, and atrial fibrillation.   He is doing very well and denies chest pain, palpitations, lightheadedness, leg swelling, and shortness of breath.   He is here with his stepdaughter.  He has not had to use nitroglycerin in the past year. His last hospitalization was in the summer of 2017 for cellulitis.   Soc: Retired from [employer], where he worked for 30 yrs.  Review of Systems: As per "subjective", otherwise negative.  No Known Allergies  Current Outpatient Prescriptions  Medication Sig Dispense Refill  . aspirin 81 MG tablet Take 81 mg by mouth daily.    . calcium-vitamin D (OSCAL WITH D) 500-200 MG-UNIT tablet Take 1 tablet by mouth.    . clindamycin (CLEOCIN) 300 MG capsule Take 1 capsule (300 mg total) by mouth 3 (three) times daily. 30 capsule 0  . donepezil (ARICEPT) 10 MG tablet Take 10 mg by mouth at bedtime.    Marland Kitchen. escitalopram (LEXAPRO) 10 MG tablet 5 mg daily.     Marland Kitchen. ezetimibe-simvastatin (VYTORIN) 10-40 MG per tablet Take 1 tablet by mouth daily.    . metoprolol succinate (TOPROL-XL) 25 MG 24 hr tablet TAKE 1 TABLET DAILY 30 tablet 11  . Multiple Vitamin (MULTIVITAMIN) capsule Take 1 capsule by mouth daily.    Marland Kitchen. NAMENDA XR 21 MG CP24 Take 21 mg by mouth daily.    Marland Kitchen. warfarin (COUMADIN) 5 MG tablet Take 1 and 1/2 tablets once daily or as directed (Patient taking differently: Take 1 and 1/2 tablets once daily or as directed and Tuesday and thursdays 2 tablets (10mg )) 45 tablet 3   No current facility-administered medications for this visit.     Past Medical History:  Diagnosis Date  . Alzheimer disease   . Atrial fibrillation (HCC)   . CAD (coronary artery disease)   . H/O echocardiogram 2013   indications: murmur, EF =50-55%  . H/O echocardiogram 2011   indications: Afib, EF 45-50%  . H/O echocardiogram 2008     atrial fib, EF =>55%  . H/O myocardial perfusion scan 2013   Indications: RBBB, NSSTT  . Mitral regurgitation     Past Surgical History:  Procedure Laterality Date  . APPENDECTOMY    . CARDIAC CATHETERIZATION  2000   626french JR4  . CORONARY ARTERY BYPASS GRAFT     LIMA to LAD, RIMA to RCA 2000  . SURGERY SCROTAL / TESTICULAR    . TOTAL KNEE ARTHROPLASTY     bilateral    Social History   Social History  . Marital status: Married    Spouse name: N/A  . Number of children: N/A  . Years of education: N/A   Occupational History  . Not on file.   Social History Main Topics  . Smoking status: Never Smoker  . Smokeless tobacco: Former [employer]     Comment: smoked as a teen.  . Alcohol use No  . Drug use: No  . Sexual activity: Not Currently   Other Topics Concern  . Not on file   Social History Narrative  . No narrative on file     Vitals:   11/24/16 1436  BP: 132/80  Pulse: 77  SpO2: 97%  Weight: 181 lb (82.1 kg)  Height: 5\' 7"  (1.702 m)    PHYSICAL EXAM General: NAD HEENT: Normal. Neck: No JVD, no thyromegaly. Lungs: Clear  to auscultation bilaterally with normal respiratory effort. CV: CV: Regular rate and irregular rhythm, normal S1/S2, no S3, no murmur. No pretibial or periankle edema. No carotid bruit.  Abdomen: Soft, nontender, no distention.  Neurologic: Alert and oriented.  Psych: Normal affect. Skin: Normal. Musculoskeletal: No gross deformities.    ECG: Most recent ECG reviewed.      ASSESSMENT AND PLAN:  1. CAD with h/o CABG: Stable ischemic heart disease. Continue aspirin, Toprol-XL, and Vytorin.  2. Hyperlipidemia: On Vytorin. Will obtain copy of lipid panel from PCP.  3. Mitral regurgitation: Appears clinically stable. No appreciable murmur on exam. Will monitor.  4. Atrial fibrillation: Heart rate is controlled on 25 mg of Toprol-XL daily. He is anticoagulated with warfarin. No changes indicated.  5. Essential hypertension:  Controlled. No changes.  Dispo: f/u 1 year.  Prentice Docker, M.D., F.A.C.C.

## 2016-12-08 DIAGNOSIS — I4891 Unspecified atrial fibrillation: Secondary | ICD-10-CM | POA: Diagnosis not present

## 2017-01-01 ENCOUNTER — Telehealth: Payer: Self-pay | Admitting: Cardiovascular Disease

## 2017-01-01 NOTE — Telephone Encounter (Signed)
error 

## 2017-01-15 DIAGNOSIS — Z7901 Long term (current) use of anticoagulants: Secondary | ICD-10-CM | POA: Diagnosis not present

## 2017-01-15 DIAGNOSIS — E785 Hyperlipidemia, unspecified: Secondary | ICD-10-CM | POA: Diagnosis not present

## 2017-01-15 DIAGNOSIS — I340 Nonrheumatic mitral (valve) insufficiency: Secondary | ICD-10-CM | POA: Diagnosis not present

## 2017-01-15 DIAGNOSIS — F411 Generalized anxiety disorder: Secondary | ICD-10-CM | POA: Diagnosis not present

## 2017-01-15 DIAGNOSIS — I2510 Atherosclerotic heart disease of native coronary artery without angina pectoris: Secondary | ICD-10-CM | POA: Diagnosis not present

## 2017-01-15 DIAGNOSIS — I2583 Coronary atherosclerosis due to lipid rich plaque: Secondary | ICD-10-CM | POA: Diagnosis not present

## 2017-01-15 DIAGNOSIS — I4891 Unspecified atrial fibrillation: Secondary | ICD-10-CM | POA: Diagnosis not present

## 2017-01-15 DIAGNOSIS — I10 Essential (primary) hypertension: Secondary | ICD-10-CM | POA: Diagnosis not present

## 2017-01-15 DIAGNOSIS — I482 Chronic atrial fibrillation: Secondary | ICD-10-CM | POA: Diagnosis not present

## 2017-01-19 DIAGNOSIS — I2510 Atherosclerotic heart disease of native coronary artery without angina pectoris: Secondary | ICD-10-CM | POA: Diagnosis not present

## 2017-01-19 DIAGNOSIS — I2583 Coronary atherosclerosis due to lipid rich plaque: Secondary | ICD-10-CM | POA: Diagnosis not present

## 2017-02-13 DIAGNOSIS — M79676 Pain in unspecified toe(s): Secondary | ICD-10-CM | POA: Diagnosis not present

## 2017-02-13 DIAGNOSIS — B351 Tinea unguium: Secondary | ICD-10-CM | POA: Diagnosis not present

## 2017-02-23 DIAGNOSIS — I10 Essential (primary) hypertension: Secondary | ICD-10-CM | POA: Diagnosis not present

## 2017-02-27 DIAGNOSIS — Z7901 Long term (current) use of anticoagulants: Secondary | ICD-10-CM | POA: Diagnosis not present

## 2017-02-27 DIAGNOSIS — I482 Chronic atrial fibrillation: Secondary | ICD-10-CM | POA: Diagnosis not present

## 2017-03-20 DIAGNOSIS — Z7901 Long term (current) use of anticoagulants: Secondary | ICD-10-CM | POA: Diagnosis not present

## 2017-03-20 DIAGNOSIS — I482 Chronic atrial fibrillation: Secondary | ICD-10-CM | POA: Diagnosis not present

## 2017-04-17 DIAGNOSIS — I482 Chronic atrial fibrillation: Secondary | ICD-10-CM | POA: Diagnosis not present

## 2017-04-17 DIAGNOSIS — Z7901 Long term (current) use of anticoagulants: Secondary | ICD-10-CM | POA: Diagnosis not present

## 2017-04-19 ENCOUNTER — Other Ambulatory Visit (HOSPITAL_COMMUNITY): Payer: Self-pay | Admitting: Physician Assistant

## 2017-04-19 DIAGNOSIS — F0390 Unspecified dementia without behavioral disturbance: Secondary | ICD-10-CM | POA: Diagnosis not present

## 2017-04-19 DIAGNOSIS — W19XXXA Unspecified fall, initial encounter: Secondary | ICD-10-CM | POA: Diagnosis not present

## 2017-04-19 DIAGNOSIS — M7989 Other specified soft tissue disorders: Secondary | ICD-10-CM

## 2017-04-19 DIAGNOSIS — S0990XA Unspecified injury of head, initial encounter: Secondary | ICD-10-CM | POA: Diagnosis not present

## 2017-04-19 DIAGNOSIS — I2583 Coronary atherosclerosis due to lipid rich plaque: Secondary | ICD-10-CM | POA: Diagnosis not present

## 2017-04-19 DIAGNOSIS — M79662 Pain in left lower leg: Secondary | ICD-10-CM

## 2017-04-19 DIAGNOSIS — I2510 Atherosclerotic heart disease of native coronary artery without angina pectoris: Secondary | ICD-10-CM | POA: Diagnosis not present

## 2017-04-19 DIAGNOSIS — I10 Essential (primary) hypertension: Secondary | ICD-10-CM | POA: Diagnosis not present

## 2017-04-20 ENCOUNTER — Other Ambulatory Visit (HOSPITAL_COMMUNITY): Payer: Medicare Other

## 2017-04-20 ENCOUNTER — Ambulatory Visit (HOSPITAL_COMMUNITY): Payer: Medicare Other

## 2017-04-20 ENCOUNTER — Ambulatory Visit (HOSPITAL_COMMUNITY)
Admission: RE | Admit: 2017-04-20 | Discharge: 2017-04-20 | Disposition: A | Discharge status: Home / Self Care | Payer: Medicare Other | Source: Ambulatory Visit | Attending: Physician Assistant | Admitting: Physician Assistant

## 2017-04-20 DIAGNOSIS — R600 Localized edema: Secondary | ICD-10-CM | POA: Diagnosis not present

## 2017-04-20 DIAGNOSIS — M79662 Pain in left lower leg: Secondary | ICD-10-CM | POA: Diagnosis not present

## 2017-04-20 DIAGNOSIS — M7989 Other specified soft tissue disorders: Secondary | ICD-10-CM | POA: Insufficient documentation

## 2017-04-20 DIAGNOSIS — W19XXXA Unspecified fall, initial encounter: Secondary | ICD-10-CM

## 2017-04-20 DIAGNOSIS — S0990XA Unspecified injury of head, initial encounter: Secondary | ICD-10-CM | POA: Insufficient documentation

## 2017-04-24 DIAGNOSIS — L84 Corns and callosities: Secondary | ICD-10-CM | POA: Diagnosis not present

## 2017-04-24 DIAGNOSIS — F411 Generalized anxiety disorder: Secondary | ICD-10-CM | POA: Diagnosis not present

## 2017-04-24 DIAGNOSIS — I2510 Atherosclerotic heart disease of native coronary artery without angina pectoris: Secondary | ICD-10-CM | POA: Diagnosis not present

## 2017-04-24 DIAGNOSIS — B351 Tinea unguium: Secondary | ICD-10-CM | POA: Diagnosis not present

## 2017-04-24 DIAGNOSIS — I70203 Unspecified atherosclerosis of native arteries of extremities, bilateral legs: Secondary | ICD-10-CM | POA: Diagnosis not present

## 2017-04-24 DIAGNOSIS — I10 Essential (primary) hypertension: Secondary | ICD-10-CM | POA: Diagnosis not present

## 2017-04-24 DIAGNOSIS — I2583 Coronary atherosclerosis due to lipid rich plaque: Secondary | ICD-10-CM | POA: Diagnosis not present

## 2017-04-27 DIAGNOSIS — I481 Persistent atrial fibrillation: Secondary | ICD-10-CM | POA: Diagnosis not present

## 2017-04-27 DIAGNOSIS — R296 Repeated falls: Secondary | ICD-10-CM | POA: Diagnosis not present

## 2017-04-27 DIAGNOSIS — I10 Essential (primary) hypertension: Secondary | ICD-10-CM | POA: Diagnosis not present

## 2017-04-27 DIAGNOSIS — R2689 Other abnormalities of gait and mobility: Secondary | ICD-10-CM | POA: Diagnosis not present

## 2017-04-27 DIAGNOSIS — I2510 Atherosclerotic heart disease of native coronary artery without angina pectoris: Secondary | ICD-10-CM | POA: Diagnosis not present

## 2017-04-27 DIAGNOSIS — F0390 Unspecified dementia without behavioral disturbance: Secondary | ICD-10-CM | POA: Diagnosis not present

## 2017-05-02 DIAGNOSIS — R296 Repeated falls: Secondary | ICD-10-CM | POA: Diagnosis not present

## 2017-05-02 DIAGNOSIS — I10 Essential (primary) hypertension: Secondary | ICD-10-CM | POA: Diagnosis not present

## 2017-05-02 DIAGNOSIS — I481 Persistent atrial fibrillation: Secondary | ICD-10-CM | POA: Diagnosis not present

## 2017-05-02 DIAGNOSIS — I2510 Atherosclerotic heart disease of native coronary artery without angina pectoris: Secondary | ICD-10-CM | POA: Diagnosis not present

## 2017-05-02 DIAGNOSIS — F0390 Unspecified dementia without behavioral disturbance: Secondary | ICD-10-CM | POA: Diagnosis not present

## 2017-05-02 DIAGNOSIS — R2689 Other abnormalities of gait and mobility: Secondary | ICD-10-CM | POA: Diagnosis not present

## 2017-05-04 DIAGNOSIS — I10 Essential (primary) hypertension: Secondary | ICD-10-CM | POA: Diagnosis not present

## 2017-05-04 DIAGNOSIS — R2689 Other abnormalities of gait and mobility: Secondary | ICD-10-CM | POA: Diagnosis not present

## 2017-05-04 DIAGNOSIS — I481 Persistent atrial fibrillation: Secondary | ICD-10-CM | POA: Diagnosis not present

## 2017-05-04 DIAGNOSIS — F0390 Unspecified dementia without behavioral disturbance: Secondary | ICD-10-CM | POA: Diagnosis not present

## 2017-05-04 DIAGNOSIS — I2510 Atherosclerotic heart disease of native coronary artery without angina pectoris: Secondary | ICD-10-CM | POA: Diagnosis not present

## 2017-05-04 DIAGNOSIS — R296 Repeated falls: Secondary | ICD-10-CM | POA: Diagnosis not present

## 2017-05-07 DIAGNOSIS — F0390 Unspecified dementia without behavioral disturbance: Secondary | ICD-10-CM | POA: Diagnosis not present

## 2017-05-07 DIAGNOSIS — R2689 Other abnormalities of gait and mobility: Secondary | ICD-10-CM | POA: Diagnosis not present

## 2017-05-07 DIAGNOSIS — R296 Repeated falls: Secondary | ICD-10-CM | POA: Diagnosis not present

## 2017-05-07 DIAGNOSIS — I2510 Atherosclerotic heart disease of native coronary artery without angina pectoris: Secondary | ICD-10-CM | POA: Diagnosis not present

## 2017-05-07 DIAGNOSIS — I481 Persistent atrial fibrillation: Secondary | ICD-10-CM | POA: Diagnosis not present

## 2017-05-07 DIAGNOSIS — I10 Essential (primary) hypertension: Secondary | ICD-10-CM | POA: Diagnosis not present

## 2017-05-09 DIAGNOSIS — F0390 Unspecified dementia without behavioral disturbance: Secondary | ICD-10-CM | POA: Diagnosis not present

## 2017-05-09 DIAGNOSIS — R2689 Other abnormalities of gait and mobility: Secondary | ICD-10-CM | POA: Diagnosis not present

## 2017-05-09 DIAGNOSIS — I10 Essential (primary) hypertension: Secondary | ICD-10-CM | POA: Diagnosis not present

## 2017-05-09 DIAGNOSIS — I481 Persistent atrial fibrillation: Secondary | ICD-10-CM | POA: Diagnosis not present

## 2017-05-09 DIAGNOSIS — R296 Repeated falls: Secondary | ICD-10-CM | POA: Diagnosis not present

## 2017-05-09 DIAGNOSIS — I2510 Atherosclerotic heart disease of native coronary artery without angina pectoris: Secondary | ICD-10-CM | POA: Diagnosis not present

## 2017-05-15 DIAGNOSIS — I10 Essential (primary) hypertension: Secondary | ICD-10-CM | POA: Diagnosis not present

## 2017-05-15 DIAGNOSIS — Z0000 Encounter for general adult medical examination without abnormal findings: Secondary | ICD-10-CM | POA: Diagnosis not present

## 2017-05-15 DIAGNOSIS — I2583 Coronary atherosclerosis due to lipid rich plaque: Secondary | ICD-10-CM | POA: Diagnosis not present

## 2017-05-15 DIAGNOSIS — I2510 Atherosclerotic heart disease of native coronary artery without angina pectoris: Secondary | ICD-10-CM | POA: Diagnosis not present

## 2017-05-15 DIAGNOSIS — Z23 Encounter for immunization: Secondary | ICD-10-CM | POA: Diagnosis not present

## 2017-05-15 DIAGNOSIS — I481 Persistent atrial fibrillation: Secondary | ICD-10-CM | POA: Diagnosis not present

## 2017-05-15 DIAGNOSIS — Z7901 Long term (current) use of anticoagulants: Secondary | ICD-10-CM | POA: Diagnosis not present

## 2017-05-17 DIAGNOSIS — F0390 Unspecified dementia without behavioral disturbance: Secondary | ICD-10-CM | POA: Diagnosis not present

## 2017-05-17 DIAGNOSIS — R296 Repeated falls: Secondary | ICD-10-CM | POA: Diagnosis not present

## 2017-05-17 DIAGNOSIS — R2689 Other abnormalities of gait and mobility: Secondary | ICD-10-CM | POA: Diagnosis not present

## 2017-05-17 DIAGNOSIS — I2510 Atherosclerotic heart disease of native coronary artery without angina pectoris: Secondary | ICD-10-CM | POA: Diagnosis not present

## 2017-05-17 DIAGNOSIS — I481 Persistent atrial fibrillation: Secondary | ICD-10-CM | POA: Diagnosis not present

## 2017-05-17 DIAGNOSIS — I10 Essential (primary) hypertension: Secondary | ICD-10-CM | POA: Diagnosis not present

## 2017-05-21 DIAGNOSIS — R296 Repeated falls: Secondary | ICD-10-CM | POA: Diagnosis not present

## 2017-05-21 DIAGNOSIS — I2510 Atherosclerotic heart disease of native coronary artery without angina pectoris: Secondary | ICD-10-CM | POA: Diagnosis not present

## 2017-05-21 DIAGNOSIS — I481 Persistent atrial fibrillation: Secondary | ICD-10-CM | POA: Diagnosis not present

## 2017-05-21 DIAGNOSIS — R2689 Other abnormalities of gait and mobility: Secondary | ICD-10-CM | POA: Diagnosis not present

## 2017-05-21 DIAGNOSIS — F0390 Unspecified dementia without behavioral disturbance: Secondary | ICD-10-CM | POA: Diagnosis not present

## 2017-05-21 DIAGNOSIS — I10 Essential (primary) hypertension: Secondary | ICD-10-CM | POA: Diagnosis not present

## 2017-05-22 DIAGNOSIS — I2583 Coronary atherosclerosis due to lipid rich plaque: Secondary | ICD-10-CM | POA: Diagnosis not present

## 2017-05-22 DIAGNOSIS — E785 Hyperlipidemia, unspecified: Secondary | ICD-10-CM | POA: Diagnosis not present

## 2017-05-22 DIAGNOSIS — I2510 Atherosclerotic heart disease of native coronary artery without angina pectoris: Secondary | ICD-10-CM | POA: Diagnosis not present

## 2017-05-25 DIAGNOSIS — R2689 Other abnormalities of gait and mobility: Secondary | ICD-10-CM | POA: Diagnosis not present

## 2017-05-25 DIAGNOSIS — I10 Essential (primary) hypertension: Secondary | ICD-10-CM | POA: Diagnosis not present

## 2017-05-25 DIAGNOSIS — I2510 Atherosclerotic heart disease of native coronary artery without angina pectoris: Secondary | ICD-10-CM | POA: Diagnosis not present

## 2017-05-25 DIAGNOSIS — F0390 Unspecified dementia without behavioral disturbance: Secondary | ICD-10-CM | POA: Diagnosis not present

## 2017-05-25 DIAGNOSIS — I481 Persistent atrial fibrillation: Secondary | ICD-10-CM | POA: Diagnosis not present

## 2017-05-25 DIAGNOSIS — R296 Repeated falls: Secondary | ICD-10-CM | POA: Diagnosis not present

## 2017-05-29 DIAGNOSIS — I481 Persistent atrial fibrillation: Secondary | ICD-10-CM | POA: Diagnosis not present

## 2017-05-29 DIAGNOSIS — Z7901 Long term (current) use of anticoagulants: Secondary | ICD-10-CM | POA: Diagnosis not present

## 2017-05-31 DIAGNOSIS — F0390 Unspecified dementia without behavioral disturbance: Secondary | ICD-10-CM | POA: Diagnosis not present

## 2017-05-31 DIAGNOSIS — I2510 Atherosclerotic heart disease of native coronary artery without angina pectoris: Secondary | ICD-10-CM | POA: Diagnosis not present

## 2017-05-31 DIAGNOSIS — R2689 Other abnormalities of gait and mobility: Secondary | ICD-10-CM | POA: Diagnosis not present

## 2017-05-31 DIAGNOSIS — I481 Persistent atrial fibrillation: Secondary | ICD-10-CM | POA: Diagnosis not present

## 2017-05-31 DIAGNOSIS — I10 Essential (primary) hypertension: Secondary | ICD-10-CM | POA: Diagnosis not present

## 2017-05-31 DIAGNOSIS — R296 Repeated falls: Secondary | ICD-10-CM | POA: Diagnosis not present

## 2017-06-04 DIAGNOSIS — R296 Repeated falls: Secondary | ICD-10-CM | POA: Diagnosis not present

## 2017-06-04 DIAGNOSIS — I481 Persistent atrial fibrillation: Secondary | ICD-10-CM | POA: Diagnosis not present

## 2017-06-04 DIAGNOSIS — I2510 Atherosclerotic heart disease of native coronary artery without angina pectoris: Secondary | ICD-10-CM | POA: Diagnosis not present

## 2017-06-04 DIAGNOSIS — I10 Essential (primary) hypertension: Secondary | ICD-10-CM | POA: Diagnosis not present

## 2017-06-04 DIAGNOSIS — R2689 Other abnormalities of gait and mobility: Secondary | ICD-10-CM | POA: Diagnosis not present

## 2017-06-04 DIAGNOSIS — F0390 Unspecified dementia without behavioral disturbance: Secondary | ICD-10-CM | POA: Diagnosis not present

## 2017-06-06 DIAGNOSIS — R2689 Other abnormalities of gait and mobility: Secondary | ICD-10-CM | POA: Diagnosis not present

## 2017-06-06 DIAGNOSIS — I2510 Atherosclerotic heart disease of native coronary artery without angina pectoris: Secondary | ICD-10-CM | POA: Diagnosis not present

## 2017-06-06 DIAGNOSIS — R296 Repeated falls: Secondary | ICD-10-CM | POA: Diagnosis not present

## 2017-06-06 DIAGNOSIS — I10 Essential (primary) hypertension: Secondary | ICD-10-CM | POA: Diagnosis not present

## 2017-06-06 DIAGNOSIS — I481 Persistent atrial fibrillation: Secondary | ICD-10-CM | POA: Diagnosis not present

## 2017-06-06 DIAGNOSIS — F0390 Unspecified dementia without behavioral disturbance: Secondary | ICD-10-CM | POA: Diagnosis not present

## 2017-06-12 DIAGNOSIS — I2510 Atherosclerotic heart disease of native coronary artery without angina pectoris: Secondary | ICD-10-CM | POA: Diagnosis not present

## 2017-06-12 DIAGNOSIS — F0390 Unspecified dementia without behavioral disturbance: Secondary | ICD-10-CM | POA: Diagnosis not present

## 2017-06-12 DIAGNOSIS — R296 Repeated falls: Secondary | ICD-10-CM | POA: Diagnosis not present

## 2017-06-12 DIAGNOSIS — I481 Persistent atrial fibrillation: Secondary | ICD-10-CM | POA: Diagnosis not present

## 2017-06-12 DIAGNOSIS — R2689 Other abnormalities of gait and mobility: Secondary | ICD-10-CM | POA: Diagnosis not present

## 2017-06-12 DIAGNOSIS — I10 Essential (primary) hypertension: Secondary | ICD-10-CM | POA: Diagnosis not present

## 2017-06-14 DIAGNOSIS — R2689 Other abnormalities of gait and mobility: Secondary | ICD-10-CM | POA: Diagnosis not present

## 2017-06-14 DIAGNOSIS — I10 Essential (primary) hypertension: Secondary | ICD-10-CM | POA: Diagnosis not present

## 2017-06-14 DIAGNOSIS — F0390 Unspecified dementia without behavioral disturbance: Secondary | ICD-10-CM | POA: Diagnosis not present

## 2017-06-14 DIAGNOSIS — I481 Persistent atrial fibrillation: Secondary | ICD-10-CM | POA: Diagnosis not present

## 2017-06-14 DIAGNOSIS — I2510 Atherosclerotic heart disease of native coronary artery without angina pectoris: Secondary | ICD-10-CM | POA: Diagnosis not present

## 2017-06-14 DIAGNOSIS — R296 Repeated falls: Secondary | ICD-10-CM | POA: Diagnosis not present

## 2017-06-19 DIAGNOSIS — R2689 Other abnormalities of gait and mobility: Secondary | ICD-10-CM | POA: Diagnosis not present

## 2017-06-19 DIAGNOSIS — I10 Essential (primary) hypertension: Secondary | ICD-10-CM | POA: Diagnosis not present

## 2017-06-19 DIAGNOSIS — R296 Repeated falls: Secondary | ICD-10-CM | POA: Diagnosis not present

## 2017-06-19 DIAGNOSIS — F0390 Unspecified dementia without behavioral disturbance: Secondary | ICD-10-CM | POA: Diagnosis not present

## 2017-06-19 DIAGNOSIS — I2510 Atherosclerotic heart disease of native coronary artery without angina pectoris: Secondary | ICD-10-CM | POA: Diagnosis not present

## 2017-06-19 DIAGNOSIS — I481 Persistent atrial fibrillation: Secondary | ICD-10-CM | POA: Diagnosis not present

## 2017-06-20 DIAGNOSIS — I10 Essential (primary) hypertension: Secondary | ICD-10-CM | POA: Diagnosis not present

## 2017-06-20 DIAGNOSIS — R2689 Other abnormalities of gait and mobility: Secondary | ICD-10-CM | POA: Diagnosis not present

## 2017-06-20 DIAGNOSIS — F0390 Unspecified dementia without behavioral disturbance: Secondary | ICD-10-CM | POA: Diagnosis not present

## 2017-06-20 DIAGNOSIS — R296 Repeated falls: Secondary | ICD-10-CM | POA: Diagnosis not present

## 2017-06-20 DIAGNOSIS — I2510 Atherosclerotic heart disease of native coronary artery without angina pectoris: Secondary | ICD-10-CM | POA: Diagnosis not present

## 2017-06-20 DIAGNOSIS — I481 Persistent atrial fibrillation: Secondary | ICD-10-CM | POA: Diagnosis not present

## 2017-06-25 DIAGNOSIS — Z23 Encounter for immunization: Secondary | ICD-10-CM | POA: Diagnosis not present

## 2017-06-25 DIAGNOSIS — I481 Persistent atrial fibrillation: Secondary | ICD-10-CM | POA: Diagnosis not present

## 2017-06-25 DIAGNOSIS — Z7901 Long term (current) use of anticoagulants: Secondary | ICD-10-CM | POA: Diagnosis not present

## 2017-07-16 DIAGNOSIS — I482 Chronic atrial fibrillation: Secondary | ICD-10-CM | POA: Diagnosis not present

## 2017-07-16 DIAGNOSIS — F411 Generalized anxiety disorder: Secondary | ICD-10-CM | POA: Diagnosis not present

## 2017-07-16 DIAGNOSIS — I10 Essential (primary) hypertension: Secondary | ICD-10-CM | POA: Diagnosis not present

## 2017-07-16 DIAGNOSIS — F0150 Vascular dementia without behavioral disturbance: Secondary | ICD-10-CM | POA: Diagnosis not present

## 2017-07-16 DIAGNOSIS — E785 Hyperlipidemia, unspecified: Secondary | ICD-10-CM | POA: Diagnosis not present

## 2017-07-16 DIAGNOSIS — I2510 Atherosclerotic heart disease of native coronary artery without angina pectoris: Secondary | ICD-10-CM | POA: Diagnosis not present

## 2017-07-16 DIAGNOSIS — I340 Nonrheumatic mitral (valve) insufficiency: Secondary | ICD-10-CM | POA: Diagnosis not present

## 2017-07-16 DIAGNOSIS — I2583 Coronary atherosclerosis due to lipid rich plaque: Secondary | ICD-10-CM | POA: Diagnosis not present

## 2017-07-24 DIAGNOSIS — Z7901 Long term (current) use of anticoagulants: Secondary | ICD-10-CM | POA: Diagnosis not present

## 2017-07-24 DIAGNOSIS — I482 Chronic atrial fibrillation: Secondary | ICD-10-CM | POA: Diagnosis not present

## 2017-07-24 DIAGNOSIS — I10 Essential (primary) hypertension: Secondary | ICD-10-CM | POA: Diagnosis not present

## 2017-07-24 DIAGNOSIS — I2583 Coronary atherosclerosis due to lipid rich plaque: Secondary | ICD-10-CM | POA: Diagnosis not present

## 2017-07-24 DIAGNOSIS — I2510 Atherosclerotic heart disease of native coronary artery without angina pectoris: Secondary | ICD-10-CM | POA: Diagnosis not present

## 2017-07-24 DIAGNOSIS — L570 Actinic keratosis: Secondary | ICD-10-CM | POA: Diagnosis not present

## 2017-07-31 DIAGNOSIS — M79676 Pain in unspecified toe(s): Secondary | ICD-10-CM | POA: Diagnosis not present

## 2017-07-31 DIAGNOSIS — L84 Corns and callosities: Secondary | ICD-10-CM | POA: Diagnosis not present

## 2017-07-31 DIAGNOSIS — I70203 Unspecified atherosclerosis of native arteries of extremities, bilateral legs: Secondary | ICD-10-CM | POA: Diagnosis not present

## 2017-07-31 DIAGNOSIS — B351 Tinea unguium: Secondary | ICD-10-CM | POA: Diagnosis not present

## 2017-08-09 DIAGNOSIS — Z7901 Long term (current) use of anticoagulants: Secondary | ICD-10-CM | POA: Diagnosis not present

## 2017-08-09 DIAGNOSIS — I482 Chronic atrial fibrillation: Secondary | ICD-10-CM | POA: Diagnosis not present

## 2017-08-24 DIAGNOSIS — I482 Chronic atrial fibrillation: Secondary | ICD-10-CM | POA: Diagnosis not present

## 2017-08-24 DIAGNOSIS — I2510 Atherosclerotic heart disease of native coronary artery without angina pectoris: Secondary | ICD-10-CM | POA: Diagnosis not present

## 2017-08-24 DIAGNOSIS — I10 Essential (primary) hypertension: Secondary | ICD-10-CM | POA: Diagnosis not present

## 2017-08-24 DIAGNOSIS — E785 Hyperlipidemia, unspecified: Secondary | ICD-10-CM | POA: Diagnosis not present

## 2017-08-24 DIAGNOSIS — Z7901 Long term (current) use of anticoagulants: Secondary | ICD-10-CM | POA: Diagnosis not present

## 2017-08-24 DIAGNOSIS — I2583 Coronary atherosclerosis due to lipid rich plaque: Secondary | ICD-10-CM | POA: Diagnosis not present

## 2017-10-21 ENCOUNTER — Encounter (HOSPITAL_COMMUNITY): Payer: Self-pay | Admitting: Emergency Medicine

## 2017-10-21 ENCOUNTER — Other Ambulatory Visit: Payer: Self-pay

## 2017-10-21 ENCOUNTER — Emergency Department (HOSPITAL_COMMUNITY): Payer: Medicare Other

## 2017-10-21 ENCOUNTER — Inpatient Hospital Stay (HOSPITAL_COMMUNITY)
Admission: EM | Admit: 2017-10-21 | Discharge: 2017-10-26 | DRG: 065 | Disposition: A | Discharge status: Skilled Nursing Facility | Payer: Medicare Other | Attending: Internal Medicine | Admitting: Internal Medicine

## 2017-10-21 DIAGNOSIS — R471 Dysarthria and anarthria: Secondary | ICD-10-CM | POA: Diagnosis present

## 2017-10-21 DIAGNOSIS — F0390 Unspecified dementia without behavioral disturbance: Secondary | ICD-10-CM

## 2017-10-21 DIAGNOSIS — I4891 Unspecified atrial fibrillation: Secondary | ICD-10-CM | POA: Diagnosis present

## 2017-10-21 DIAGNOSIS — I083 Combined rheumatic disorders of mitral, aortic and tricuspid valves: Secondary | ICD-10-CM | POA: Diagnosis present

## 2017-10-21 DIAGNOSIS — I10 Essential (primary) hypertension: Secondary | ICD-10-CM | POA: Diagnosis present

## 2017-10-21 DIAGNOSIS — Z96653 Presence of artificial knee joint, bilateral: Secondary | ICD-10-CM | POA: Diagnosis present

## 2017-10-21 DIAGNOSIS — R531 Weakness: Secondary | ICD-10-CM

## 2017-10-21 DIAGNOSIS — G309 Alzheimer's disease, unspecified: Secondary | ICD-10-CM | POA: Diagnosis present

## 2017-10-21 DIAGNOSIS — R55 Syncope and collapse: Secondary | ICD-10-CM | POA: Diagnosis not present

## 2017-10-21 DIAGNOSIS — N179 Acute kidney failure, unspecified: Secondary | ICD-10-CM | POA: Diagnosis present

## 2017-10-21 DIAGNOSIS — R197 Diarrhea, unspecified: Secondary | ICD-10-CM | POA: Diagnosis not present

## 2017-10-21 DIAGNOSIS — I2510 Atherosclerotic heart disease of native coronary artery without angina pectoris: Secondary | ICD-10-CM | POA: Diagnosis present

## 2017-10-21 DIAGNOSIS — Z79899 Other long term (current) drug therapy: Secondary | ICD-10-CM

## 2017-10-21 DIAGNOSIS — R269 Unspecified abnormalities of gait and mobility: Secondary | ICD-10-CM

## 2017-10-21 DIAGNOSIS — R509 Fever, unspecified: Secondary | ICD-10-CM | POA: Diagnosis not present

## 2017-10-21 DIAGNOSIS — E785 Hyperlipidemia, unspecified: Secondary | ICD-10-CM | POA: Diagnosis present

## 2017-10-21 DIAGNOSIS — R296 Repeated falls: Secondary | ICD-10-CM | POA: Diagnosis present

## 2017-10-21 DIAGNOSIS — Z7901 Long term (current) use of anticoagulants: Secondary | ICD-10-CM

## 2017-10-21 DIAGNOSIS — Z8673 Personal history of transient ischemic attack (TIA), and cerebral infarction without residual deficits: Secondary | ICD-10-CM

## 2017-10-21 DIAGNOSIS — F0280 Dementia in other diseases classified elsewhere without behavioral disturbance: Secondary | ICD-10-CM | POA: Diagnosis present

## 2017-10-21 DIAGNOSIS — R29704 NIHSS score 4: Secondary | ICD-10-CM | POA: Diagnosis present

## 2017-10-21 DIAGNOSIS — I63412 Cerebral infarction due to embolism of left middle cerebral artery: Secondary | ICD-10-CM | POA: Diagnosis not present

## 2017-10-21 DIAGNOSIS — Z7982 Long term (current) use of aspirin: Secondary | ICD-10-CM

## 2017-10-21 DIAGNOSIS — I63512 Cerebral infarction due to unspecified occlusion or stenosis of left middle cerebral artery: Secondary | ICD-10-CM | POA: Diagnosis present

## 2017-10-21 DIAGNOSIS — Z951 Presence of aortocoronary bypass graft: Secondary | ICD-10-CM

## 2017-10-21 DIAGNOSIS — Z87891 Personal history of nicotine dependence: Secondary | ICD-10-CM

## 2017-10-21 LAB — PROTIME-INR
INR: 2.97
Prothrombin Time: 30.6 seconds — ABNORMAL HIGH (ref 11.4–15.2)

## 2017-10-21 LAB — URINALYSIS, ROUTINE W REFLEX MICROSCOPIC
Bacteria, UA: NONE SEEN
Bilirubin Urine: NEGATIVE
Glucose, UA: NEGATIVE mg/dL
HGB URINE DIPSTICK: NEGATIVE
Ketones, ur: 5 mg/dL — AB
Leukocytes, UA: NEGATIVE
Nitrite: NEGATIVE
PROTEIN: 30 mg/dL — AB
Specific Gravity, Urine: 1.015 (ref 1.005–1.030)
pH: 5 (ref 5.0–8.0)

## 2017-10-21 LAB — CBC
HEMATOCRIT: 39.3 % (ref 39.0–52.0)
Hemoglobin: 13 g/dL (ref 13.0–17.0)
MCH: 32.4 pg (ref 26.0–34.0)
MCHC: 33.1 g/dL (ref 30.0–36.0)
MCV: 98 fL (ref 78.0–100.0)
PLATELETS: 144 10*3/uL — AB (ref 150–400)
RBC: 4.01 MIL/uL — AB (ref 4.22–5.81)
RDW: 12.8 % (ref 11.5–15.5)
WBC: 9.6 10*3/uL (ref 4.0–10.5)

## 2017-10-21 LAB — COMPREHENSIVE METABOLIC PANEL
ALT: 18 U/L (ref 17–63)
AST: 23 U/L (ref 15–41)
Albumin: 3.4 g/dL — ABNORMAL LOW (ref 3.5–5.0)
Alkaline Phosphatase: 55 U/L (ref 38–126)
Anion gap: 9 (ref 5–15)
BUN: 28 mg/dL — ABNORMAL HIGH (ref 6–20)
CHLORIDE: 106 mmol/L (ref 101–111)
CO2: 26 mmol/L (ref 22–32)
CREATININE: 1.47 mg/dL — AB (ref 0.61–1.24)
Calcium: 9.3 mg/dL (ref 8.9–10.3)
GFR, EST AFRICAN AMERICAN: 49 mL/min — AB (ref >60)
GFR, EST NON AFRICAN AMERICAN: 42 mL/min — AB (ref >60)
Glucose, Bld: 161 mg/dL — ABNORMAL HIGH (ref 65–99)
POTASSIUM: 4.4 mmol/L (ref 3.5–5.1)
Sodium: 141 mmol/L (ref 135–145)
Total Bilirubin: 0.7 mg/dL (ref 0.3–1.2)
Total Protein: 6.7 g/dL (ref 6.5–8.1)

## 2017-10-21 MED ORDER — SODIUM CHLORIDE 0.9 % IV SOLN
INTRAVENOUS | Status: DC
  Administered 2017-10-22: 02:00:00 via INTRAVENOUS

## 2017-10-21 MED ORDER — SODIUM CHLORIDE 0.9 % IV BOLUS (SEPSIS)
500.0000 mL | Freq: Once | INTRAVENOUS | Status: AC
  Administered 2017-10-21: 500 mL via INTRAVENOUS

## 2017-10-21 NOTE — ED Triage Notes (Addendum)
Per EMS, pt reports frequent falls at home. Family wanted pt checked out. Pt alert and at baseline mentation. Skin tear noted to right elbow and generalized bruises. nad noted. cbg 174.  Pt denies weakness but EMS reports assisted pt to stand and reports "legs gave out."

## 2017-10-21 NOTE — H&P (Signed)
4        History and Physical    Joseph Pena MWU:[id number] DOB: [birth year] DOA: 10/21/2017  PCP: Octavio Graves, DO   Patient coming from: Home.  I have personally briefly reviewed patient's old medical records in [place]  Chief Complaint: Multiple falls.  HPI: Joseph Pena is a 84 y.o. male with medical history significant of Alzheimer's disease, atrial fibrillation, CAD, mitral regurgitation, hyperlipidemia who is brought to the emergency department via EMS due to having frequent falls at home for the past 2-3 days.  Apparently, family stated earlier that he has had about 4 falls in the past 2 days.  He normally uses a walker, but needs to be reminded about it.  Patient's wife, who is the patient's primary caregiver, is unable to help with transfers.  There has not being any other known complaints.  The patient has been sleeping well and has good appetite.  History was provided by Dr. Ashok Cordia, chart notes and staff.  ED Course: Initial vital signs temperature 97.3F, pulse 74, respirations 18, blood pressure 122/81 mmHg and O2 sat 98% on room air.  His urinalysis showed mild ketonuria of 5 mg/dL.  Mild proteinuria 30 mg/dL.  Otherwise was unremarkable.  CBC showed a white count of 9.6, hemoglobin of 13.0 g/dL and platelets 144.  PT 30.6 seconds and INR 2.97.  CMP shows a glucose of 161, BUN of 20 and a creatinine of 1.47 mg/dL.  Creatinine is 0.11 to 0.21 higher than baseline over the last few years.  His albumin was 3.4 g/dL, all other hepatic function panel values are normal.  Magnesium level was 1.9 Miller per deciliter.    Imaging: CT of the head without contrast shows atrophy with small vessel chronic ischemic changes of the deep cerebral white matter and all lacunar infarcts in the left pons and bilateral basal ganglia.  However, there are no acute intracranial abnormalities.  Please see images and full radiology report for further detail.  Treatment in the ED: The  patient received two 500 mL boluses before I evaluated him.  Review of Systems: Unable to fully obtain, due to the patient's Alzheimer's dementia.   Past Medical History:  Diagnosis Date  . Alzheimer disease   . Atrial fibrillation ([place])   . CAD (coronary artery disease)   . H/O echocardiogram 2013   indications: murmur, EF =50-55%  . H/O echocardiogram 2011   indications: Afib, EF 45-50%  . H/O echocardiogram 2008   atrial fib, EF =>55%  . H/O myocardial perfusion scan 2013   Indications: RBBB, NSSTT  . Mitral regurgitation     Past Surgical History:  Procedure Laterality Date  . APPENDECTOMY    . CARDIAC CATHETERIZATION  2000   71fench JR4  . CORONARY ARTERY BYPASS GRAFT     LIMA to LAD, RIMA to RCA 2000  . SURGERY SCROTAL / TESTICULAR    . TOTAL KNEE ARTHROPLASTY     bilateral     reports that  has never smoked. He has quit using smokeless tobacco. He reports that he does not drink alcohol or use drugs.  No Known Allergies  Family History  Problem Relation Age of Onset  . Heart disease Father   . Hypertension Father   . Diabetes Father   . Arthritis Father   . Heart attack Mother     Prior to Admission medications   Medication Sig Start Date End Date Taking? Authorizing Provider  aspirin 81 MG tablet Take  81 mg by mouth daily.   Yes [provider]  atorvastatin (LIPITOR) 20 MG tablet Take 20 mg by mouth daily.   Yes [provider]  donepezil (ARICEPT) 10 MG tablet Take 10 mg by mouth at bedtime.   Yes [provider]  escitalopram (LEXAPRO) 10 MG tablet Take 5 mg by mouth daily.  12/23/13  Yes [provider]  metoprolol succinate (TOPROL-XL) 25 MG 24 hr tablet TAKE 1 TABLET DAILY Patient taking differently: Take 25 mg by mouth daily.  02/18/14  Yes Minus Breeding, MD  Multiple Vitamin (MULTIVITAMIN) capsule Take 1 capsule by mouth at bedtime.    Yes [provider]  warfarin (COUMADIN) 5 MG tablet Take 1 and 1/2  tablets once daily or as directed Patient taking differently: Take 7.5 mg by mouth every morning.  02/24/13  Yes Troy Sine, MD    Physical Exam: Vitals:   10/21/17 2115 10/21/17 2130 10/21/17 2145 10/21/17 2200  BP:  (!) 141/78  (!) 150/56  Pulse: (!) 58 67    Resp: (!) 24 (!) _0 Temp:      TempSrc:      SpO2: 99% 96%    Weight:      Height:        Constitutional: NAD, calm, comfortable Eyes: PERRL, lids and conjunctivae normal ENMT: Mucous membranes are moist. Posterior pharynx clear of any exudate or lesions.  Neck: normal, supple, no masses, no thyromegaly Respiratory: clear to auscultation bilaterally, no wheezing, no crackles. Normal respiratory effort. No accessory muscle use.  Cardiovascular: Irregularly irregular, no murmurs / rubs / gallops. No extremity edema. 2+ pedal pulses. No carotid bruits.  Abdomen: Soft, no tenderness, no masses palpated. No hepatosplenomegaly. Bowel sounds positive.  Musculoskeletal: no clubbing / cyanosis. Good ROM, no contractures. Normal muscle tone.  Skin: Few areas of ecchymosis on extremities.  Right elbow laceration. Neurologic: CN 2-12 grossly intact. Sensation intact, DTR normal. Strength 5/5 in all 4.  Psychiatric: Alert and oriented x 2, mildly disoriented to time and situation. Normal mood.    Labs on Admission: I have personally reviewed following labs and imaging studies  CBC: Recent Labs  Lab 10/21/17 1703  WBC 9.6  HGB 13.0  HCT 39.3  MCV 98.0  PLT 195*   Basic Metabolic Panel: Recent Labs  Lab 10/21/17 1703  NA 141  K 4.4  CL 106  CO2 26  GLUCOSE 161*  BUN 28*  CREATININE 1.47*  CALCIUM 9.3   GFR: Estimated Creatinine Clearance: 36.2 mL/min (A) (by C-G formula based on SCr of 1.47 mg/dL (H)). Liver Function Tests: Recent Labs  Lab 10/21/17 1703  AST 23  ALT 18  ALKPHOS 55  BILITOT 0.7  PROT 6.7  ALBUMIN 3.4*   No results for input(s): LIPASE, AMYLASE in the last 168 hours. No results  for input(s): AMMONIA in the last 168 hours. Coagulation Profile: Recent Labs  Lab 10/21/17 1703  INR 2.97   Cardiac Enzymes: No results for input(s): CKTOTAL, CKMB, CKMBINDEX, TROPONINI in the last 168 hours. BNP (last 3 results) No results for input(s): PROBNP in the last 8760 hours. HbA1C: No results for input(s): HGBA1C in the last 72 hours. CBG: No results for input(s): GLUCAP in the last 168 hours. Lipid Profile: No results for input(s): CHOL, HDL, LDLCALC, TRIG, CHOLHDL, LDLDIRECT in the last 72 hours. Thyroid Function Tests: No results for input(s): TSH, T4TOTAL, FREET4, T3FREE, THYROIDAB in the last 72 hours. Anemia Panel: No results  for input(s): VITAMINB12, FOLATE, FERRITIN, TIBC, IRON, RETICCTPCT in the last 72 hours. Urine analysis:    Component Value Date/Time   COLORURINE YELLOW 10/21/2017 2054   APPEARANCEUR HAZY (A) 10/21/2017 2054   LABSPEC 1.015 10/21/2017 2054   PHURINE 5.0 10/21/2017 2054   GLUCOSEU NEGATIVE 10/21/2017 2054   HGBUR NEGATIVE 10/21/2017 2054   BILIRUBINUR NEGATIVE 10/21/2017 2054   KETONESUR 5 (A) 10/21/2017 2054   PROTEINUR 30 (A) 10/21/2017 2054   UROBILINOGEN 1.0 06/17/2010 1239   NITRITE NEGATIVE 10/21/2017 2054   LEUKOCYTESUR NEGATIVE 10/21/2017 2054    Radiological Exams on Admission: Ct Head Wo Contrast  Result Date: 10/21/2017 CLINICAL DATA:  Frequent falls at home, minor head trauma, history Alzheimer's, atrial fibrillation, coronary artery disease, hypertension EXAM: CT HEAD WITHOUT CONTRAST TECHNIQUE: Contiguous axial images were obtained from the base of the skull through the vertex without intravenous contrast. Sagittal and coronal MPR images reconstructed from axial data set. COMPARISON:  04/20/2017 FINDINGS: Brain: Generalized atrophy. Normal ventricular morphology. No midline shift or mass effect. Small vessel chronic ischemic changes of deep cerebral white matter. Old lacunar infarcts in BILATERAL basal ganglia and LEFT  pons. No intracranial hemorrhage, mass lesion or evidence of acute infarction. No extra-axial fluid collections. Vascular: Atherosclerotic calcifications of internal carotid and vertebral arteries at skull base Skull: Intact Sinuses/Orbits: Clear Other: N/A IMPRESSION: Atrophy with small vessel chronic ischemic changes of deep cerebral white matter. Old lacunar infarcts in LEFT pons and BILATERAL basal ganglia. No acute intracranial abnormalities. Electronically Signed   By: Lavonia Dana M.D.   On: 10/21/2017 18:03   04/06/2016 echocardiogram complete ------------------------------------------------------------------- LV EF: 55% -   60%  ------------------------------------------------------------------- Indications:      Elevated Troponin.  ------------------------------------------------------------------- History:   PMH:   Atrial fibrillation.  Coronary artery disease. Risk factors:  Hypertension. Dyslipidemia.  ------------------------------------------------------------------- Study Conclusions  - Left ventricle: The cavity size was normal. Wall thickness was   increased in a pattern of mild LVH. Systolic function was normal.   The estimated ejection fraction was in the range of 55% to 60%.   Wall motion was normal; there were no regional wall motion   abnormalities. The study is not technically sufficient to allow   evaluation of LV diastolic function. - Aortic valve: Mildly calcified annulus. Trileaflet; mildly   calcified leaflets. There was mild to moderate regurgitation. - Aortic root: The aortic root was mildly dilated. - Mitral valve: Calcified annulus. Mildly thickened leaflets .   There was moderate regurgitation. - Left atrium: The atrium was severely dilated. - Right atrium: The atrium was mildly dilated. Central venous   pressure (est): 3 mm Hg. - Atrial septum: No defect or patent foramen ovale was identified. - Tricuspid valve: There was mild regurgitation. -  Pulmonary arteries: PA peak pressure: 46 mm Hg (S). - Pericardium, extracardiac: There was no pericardial effusion.  Impressions:  - Mild LVH with LVEF 55-60%. Indeterminate diastolic function in   the setting of atrial fibrillation. Severe left atrial   enlargement. MAC with mildly thickened mitral leaflets and   moderate mitral regurgitation. Mildly dilated aortic root. Mildly   sclerotic aortic valve with mild to moderate aortic   regurgitation. Mild right atrial enlargement. Mild tricuspid   regurgitation with PASP 46 mmHg.  EKG: Independently reviewed. Vent. rate 63 BPM PR interval * ms QRS duration 142 ms QT/QTc 410/420 ms P-R-T axes * 78 228 Atrial fibrillation Left bundle branch block Baseline wander in lead(s) I No changes when compared to  previous.  Assessment/Plan Principal Problem:   Near syncope I suspect volume depletion. However, given history and symptoms, valvular heart disease  and cerebrovascular disease should be considered. Continue with gentle and time limited IV hydration. Monitor blood pressure and heart rate. Neuro checks every 4 hours. PT evaluation in the morning. Check echocardiogram and carotid Doppler in a.m. Consider MR/MRA date of brain if symptoms persist. Case management evaluation prior to discharge.  Active Problems:   Atrial fibrillation (HCC) CHA?DS?-VASc Score of 6 Continue warfarin per pharmacy. Continue metoprolol succinate 25 mg p.o. daily.    CAD (coronary artery disease) Continue aspirin, atorvastatin and metoprolol.    HTN (hypertension) Continue metoprolol. Monitor blood pressure and heart rate.    Hyperlipidemia Continue atorvastatin 20 mg p.o. daily. Monitor hepatic function panel as needed. Fasting lipid profile to be followed as an outpatient.    Alzheimer disease Continue Aricept 10 mg p.o. at bedtime. Continue Lexapro 5 mg p.o. daily.   DVT prophylaxis: On warfarin.. Code Status: Full code. Family  Communication:  Disposition Plan: Observation for gentle IV hydration. Consults called:  Admission status: Observation/telemetry.   Reubin Milan MD [facility] Pager [phone]  If 7PM-7AM, please contact night-coverage [email or web address] Password [facility]  10/21/2017, 11:18 PM

## 2017-10-21 NOTE — ED Provider Notes (Signed)
[facility] EMERGENCY DEPARTMENT Provider Note   CSN: [id number] Arrival date & time: 10/21/17  1620     History   Chief Complaint Chief Complaint  Patient presents with  . Fall    HPI Joseph Pena is a 84 y.o. male.  Patient w recent falls at home. Patient states at times his knees just give way. Does feel generally weak ?dizziness vs faintness prior to falls and knees getting weak.  Denies syncope or loc. Denies chest pain or discomfort. No palpitations. Denies headache. No neck or back pain. No abd pain. Denies any recent blood loss, no gi bleeding or melena. Denies change in meds or new meds.  States has been eating and drinking normally. Pt is limited historian, dementia - level 5 caveat.    Family arrives, indicates 4 falls in past two days. Although gradual decline in cognitive fxn and physical fxn, they do note acute change in past 2-3 days hx tia/cva (on ct imaging) ? Possible new small cva in past couple days.  Pt has walker at home, but doesn't remember to use. They indicate pts wife unable to lift and/or otherwise help transfer or walk. Pt unable to get out of stretcher/walk in ED.    The history is provided by the patient and the EMS personnel. The history is limited by the condition of the patient.  Fall  Pertinent negatives include no chest pain, no abdominal pain, no headaches and no shortness of breath.    Past Medical History:  Diagnosis Date  . Alzheimer disease   . Atrial fibrillation (HCC)   . CAD (coronary artery disease)   . H/O echocardiogram 2013   indications: murmur, EF =50-55%  . H/O echocardiogram 2011   indications: Afib, EF 45-50%  . H/O echocardiogram 2008   atrial fib, EF =>55%  . H/O myocardial perfusion scan 2013   Indications: RBBB, NSSTT  . Mitral regurgitation     Patient Active Problem List   Diagnosis Date Noted  . CKD (chronic kidney disease) stage 3, GFR 30-59 ml/min (HCC) 04/05/2016  . Cellulitis of left upper extremity  04/05/2016  . Elevated troponin 04/05/2016  . SIRS (systemic inflammatory response syndrome) (HCC) 04/04/2016  . CAD (coronary artery disease) 08/15/2013  . HTN (hypertension) 08/15/2013  . Hyperlipidemia LDL goal < 70 08/15/2013  . Atrial fibrillation (HCC) 12/01/2012  . Long term (current) use of anticoagulants 12/01/2012    Past Surgical History:  Procedure Laterality Date  . APPENDECTOMY    . CARDIAC CATHETERIZATION  2000   72french JR4  . CORONARY ARTERY BYPASS GRAFT     LIMA to LAD, RIMA to RCA 2000  . SURGERY SCROTAL / TESTICULAR    . TOTAL KNEE ARTHROPLASTY     bilateral       Home Medications    Prior to Admission medications   Medication Sig Start Date End Date Taking? Authorizing Provider  aspirin 81 MG tablet Take 81 mg by mouth daily.    [provider]  calcium-vitamin D (OSCAL WITH D) 500-200 MG-UNIT tablet Take 1 tablet by mouth.    [provider]  clindamycin (CLEOCIN) 300 MG capsule Take 1 capsule (300 mg total) by mouth 3 (three) times daily. 04/06/16   Philip Aspen, Limmie Patricia, MD  donepezil (ARICEPT) 10 MG tablet Take 10 mg by mouth at bedtime.    [provider]  escitalopram (LEXAPRO) 10 MG tablet 5 mg daily.  12/23/13   [provider]  ezetimibe-simvastatin (VYTORIN)  10-40 MG per tablet Take 1 tablet by mouth daily.    [provider]  metoprolol succinate (TOPROL-XL) 25 MG 24 hr tablet TAKE 1 TABLET DAILY 02/18/14   Rollene RotundaHochrein, James, MD  Multiple Vitamin (MULTIVITAMIN) capsule Take 1 capsule by mouth daily.    [provider]  NAMENDA XR 21 MG CP24 Take 21 mg by mouth daily. 02/22/16   [provider]  warfarin (COUMADIN) 5 MG tablet Take 1 and 1/2 tablets once daily or as directed Patient taking differently: Take 1 and 1/2 tablets once daily or as directed and Tuesday and thursdays 2 tablets (10mg ) 02/24/13   Lennette BihariKelly, Thomas A, MD    Family History Family History  Problem Relation Age of  Onset  . Heart disease Father   . Hypertension Father   . Diabetes Father   . Arthritis Father   . Heart attack Mother     Social History Social History   Tobacco Use  . Smoking status: Never Smoker  . Smokeless tobacco: Former NeurosurgeonUser  . Tobacco comment: smoked as a teen.  Substance Use Topics  . Alcohol use: No    Alcohol/week: 0.0 oz  . Drug use: No     Allergies   Patient has no known allergies.   Review of Systems Review of Systems  Constitutional: Negative for chills and fever.  HENT: Negative for sore throat.   Eyes: Negative for visual disturbance.  Respiratory: Negative for cough and shortness of breath.   Cardiovascular: Negative for chest pain, palpitations and leg swelling.  Gastrointestinal: Negative for abdominal pain, blood in stool, diarrhea and vomiting.  Endocrine: Negative for polyuria.  Genitourinary: Negative for dysuria and flank pain.  Musculoskeletal: Negative for back pain and neck pain.  Skin: Negative for rash.  Neurological: Positive for weakness and light-headedness. Negative for numbness and headaches.  Hematological: Does not bruise/bleed easily.  Psychiatric/Behavioral: Negative for confusion.     Physical Exam Updated Vital Signs BP 122/81 (BP Location: Left Arm)   Pulse 74   Temp 97.9 F (36.6 C) (Oral)   Resp 18   Ht 1.727 m (5\' 8" )   Wt 81.6 kg (180 lb)   SpO2 98%   BMI 27.37 kg/m   Physical Exam  Constitutional: He appears well-developed and well-nourished. No distress.  HENT:  Head: Atraumatic.  Mouth/Throat: Oropharynx is clear and moist.  Eyes: Conjunctivae are normal. Pupils are equal, round, and reactive to light.  Neck: Neck supple. No tracheal deviation present.  Cardiovascular: Normal rate, normal heart sounds and intact distal pulses. Exam reveals no gallop and no friction rub.  No murmur heard. Pulmonary/Chest: Effort normal and breath sounds normal. No accessory muscle usage. No respiratory distress. He  exhibits no tenderness.  Abdominal: Soft. Bowel sounds are normal. He exhibits no distension. There is no tenderness.  Genitourinary:  Genitourinary Comments: No cva tenderness  Musculoskeletal: He exhibits no edema.  Neurological: He is alert. No cranial nerve deficit.  Alert, speech clear/fluent. Confused/dementia. Motor intact bil, stre 4+/5 diffusely. sens grossly intact. Unable to stand or ambulate.   Skin: Skin is warm and dry. No rash noted. He is not diaphoretic.  Psychiatric: He has a normal mood and affect.  Nursing note and vitals reviewed.    ED Treatments / Results  Labs (all labs ordered are listed, but only abnormal results are displayed) Results for orders placed or performed during the hospital encounter of 10/21/17  Comprehensive metabolic panel  Result Value Ref Range   Sodium  141 135 - 145 mmol/L   Potassium 4.4 3.5 - 5.1 mmol/L   Chloride 106 101 - 111 mmol/L   CO2 26 22 - 32 mmol/L   Glucose, Bld 161 (H) 65 - 99 mg/dL   BUN 28 (H) 6 - 20 mg/dL   Creatinine, Ser 1.61 (H) 0.61 - 1.24 mg/dL   Calcium 9.3 8.9 - 09.6 mg/dL   Total Protein 6.7 6.5 - 8.1 g/dL   Albumin 3.4 (L) 3.5 - 5.0 g/dL   AST 23 15 - 41 U/L   ALT 18 17 - 63 U/L   Alkaline Phosphatase 55 38 - 126 U/L   Total Bilirubin 0.7 0.3 - 1.2 mg/dL   GFR calc non Af Amer 42 (L) >60 mL/min   GFR calc Af Amer 49 (L) >60 mL/min   Anion gap 9 5 - 15  CBC  Result Value Ref Range   WBC 9.6 4.0 - 10.5 K/uL   RBC 4.01 (L) 4.22 - 5.81 MIL/uL   Hemoglobin 13.0 13.0 - 17.0 g/dL   HCT 04.5 40.9 - 81.1 %   MCV 98.0 78.0 - 100.0 fL   MCH 32.4 26.0 - 34.0 pg   MCHC 33.1 30.0 - 36.0 g/dL   RDW 91.4 78.2 - 95.6 %   Platelets 144 (L) 150 - 400 K/uL  Protime-INR  Result Value Ref Range   Prothrombin Time 30.6 (H) 11.4 - 15.2 seconds   INR 2.97   Urinalysis, Routine w reflex microscopic  Result Value Ref Range   Color, Urine YELLOW YELLOW   APPearance HAZY (A) CLEAR   Specific Gravity, Urine 1.015 1.005 -  1.030   pH 5.0 5.0 - 8.0   Glucose, UA NEGATIVE NEGATIVE mg/dL   Hgb urine dipstick NEGATIVE NEGATIVE   Bilirubin Urine NEGATIVE NEGATIVE   Ketones, ur 5 (A) NEGATIVE mg/dL   Protein, ur 30 (A) NEGATIVE mg/dL   Nitrite NEGATIVE NEGATIVE   Leukocytes, UA NEGATIVE NEGATIVE   RBC / HPF 0-5 0 - 5 RBC/hpf   WBC, UA 0-5 0 - 5 WBC/hpf   Bacteria, UA NONE SEEN NONE SEEN   Squamous Epithelial / LPF 0-5 (A) NONE SEEN   Mucus PRESENT     EKG  EKG Interpretation  Date/Time:  Sunday October 21 2017 17:06:27 EST Ventricular Rate:  63 PR Interval:    QRS Duration: 142 QT Interval:  410 QTC Calculation: 420 R Axis:   78 Text Interpretation:  Atrial fibrillation Left bundle branch block Baseline wander in lead(s) I Confirmed by Cathren Laine ([id number]) on 10/21/2017 5:08:26 PM       Radiology Ct Head Wo Contrast  Result Date: 10/21/2017 CLINICAL DATA:  Frequent falls at home, minor head trauma, history Alzheimer's, atrial fibrillation, coronary artery disease, hypertension EXAM: CT HEAD WITHOUT CONTRAST TECHNIQUE: Contiguous axial images were obtained from the base of the skull through the vertex without intravenous contrast. Sagittal and coronal MPR images reconstructed from axial data set. COMPARISON:  04/20/2017 FINDINGS: Brain: Generalized atrophy. Normal ventricular morphology. No midline shift or mass effect. Small vessel chronic ischemic changes of deep cerebral white matter. Old lacunar infarcts in BILATERAL basal ganglia and LEFT pons. No intracranial hemorrhage, mass lesion or evidence of acute infarction. No extra-axial fluid collections. Vascular: Atherosclerotic calcifications of internal carotid and vertebral arteries at skull base Skull: Intact Sinuses/Orbits: Clear Other: N/A IMPRESSION: Atrophy with small vessel chronic ischemic changes of deep cerebral white matter. Old lacunar infarcts in LEFT pons and BILATERAL basal ganglia. No  acute intracranial abnormalities. Electronically Signed    By: Ulyses Southward M.D.   On: 10/21/2017 18:03    Procedures Procedures (including critical care time)  Medications Ordered in ED Medications - No data to display   Initial Impression / Assessment and Plan / ED Course  I have reviewed the triage vital signs and the nursing notes.  Pertinent labs & imaging results that were available during my care of the patient were reviewed by me and considered in my medical decision making (see chart for details).  Iv ns. Labs.  Reviewed nursing notes and prior charts for additional history.   Labs and ct.   Ct neg acute however note made of prior/old lacunar infarcts.   Given inability to stand/walk, 4 falls in patient on coumadin, ?etiology, near syncope vs recent/new cva - will admit to medical service for further workup.  hospitalists consulted for admission.  Patient also wound benefit from SW consult during hospital stay as when over acute illness, may need eventual placement.   Final Clinical Impressions(s) / ED Diagnoses   Final diagnoses:  None    ED Discharge Orders    None       Cathren Laine, MD 10/21/17 2150

## 2017-10-22 ENCOUNTER — Observation Stay (HOSPITAL_COMMUNITY): Payer: Medicare Other

## 2017-10-22 ENCOUNTER — Encounter (HOSPITAL_COMMUNITY): Payer: Self-pay | Admitting: *Deleted

## 2017-10-22 DIAGNOSIS — F0390 Unspecified dementia without behavioral disturbance: Secondary | ICD-10-CM

## 2017-10-22 DIAGNOSIS — I63512 Cerebral infarction due to unspecified occlusion or stenosis of left middle cerebral artery: Secondary | ICD-10-CM

## 2017-10-22 DIAGNOSIS — N179 Acute kidney failure, unspecified: Secondary | ICD-10-CM | POA: Diagnosis not present

## 2017-10-22 DIAGNOSIS — I480 Paroxysmal atrial fibrillation: Secondary | ICD-10-CM

## 2017-10-22 LAB — BASIC METABOLIC PANEL
Anion gap: 9 (ref 5–15)
BUN: 21 mg/dL — ABNORMAL HIGH (ref 6–20)
CALCIUM: 8.8 mg/dL — AB (ref 8.9–10.3)
CO2: 26 mmol/L (ref 22–32)
CREATININE: 1.33 mg/dL — AB (ref 0.61–1.24)
Chloride: 105 mmol/L (ref 101–111)
GFR calc Af Amer: 55 mL/min — ABNORMAL LOW (ref >60)
GFR calc non Af Amer: 47 mL/min — ABNORMAL LOW (ref >60)
GLUCOSE: 96 mg/dL (ref 65–99)
Potassium: 4.3 mmol/L (ref 3.5–5.1)
Sodium: 140 mmol/L (ref 135–145)

## 2017-10-22 LAB — PROTIME-INR
INR: 3.05
PROTHROMBIN TIME: 31.3 s — AB (ref 11.4–15.2)

## 2017-10-22 LAB — MAGNESIUM: MAGNESIUM: 1.9 mg/dL (ref 1.7–2.4)

## 2017-10-22 MED ORDER — WARFARIN SODIUM 5 MG PO TABS
5.0000 mg | ORAL_TABLET | Freq: Once | ORAL | Status: AC
  Administered 2017-10-22: 5 mg via ORAL
  Filled 2017-10-22: qty 1

## 2017-10-22 MED ORDER — ASPIRIN EC 81 MG PO TBEC
81.0000 mg | DELAYED_RELEASE_TABLET | Freq: Every day | ORAL | Status: DC
  Administered 2017-10-22 – 2017-10-23 (×2): 81 mg via ORAL
  Filled 2017-10-22 (×2): qty 1

## 2017-10-22 MED ORDER — SODIUM CHLORIDE 0.9 % IV SOLN
INTRAVENOUS | Status: AC
Start: 2017-10-22 — End: 2017-10-23
  Administered 2017-10-22 – 2017-10-23 (×2): via INTRAVENOUS

## 2017-10-22 MED ORDER — ESCITALOPRAM OXALATE 10 MG PO TABS
5.0000 mg | ORAL_TABLET | Freq: Every day | ORAL | Status: DC
  Administered 2017-10-22 – 2017-10-26 (×5): 5 mg via ORAL
  Filled 2017-10-22 (×6): qty 1

## 2017-10-22 MED ORDER — ATORVASTATIN CALCIUM 20 MG PO TABS
20.0000 mg | ORAL_TABLET | Freq: Every day | ORAL | Status: DC
  Administered 2017-10-22 – 2017-10-23 (×2): 20 mg via ORAL
  Filled 2017-10-22 (×2): qty 1

## 2017-10-22 MED ORDER — METOPROLOL SUCCINATE ER 25 MG PO TB24
12.5000 mg | ORAL_TABLET | Freq: Every day | ORAL | Status: DC
  Filled 2017-10-22 (×2): qty 1

## 2017-10-22 MED ORDER — WARFARIN - PHARMACIST DOSING INPATIENT: Freq: Every day | Status: DC

## 2017-10-22 MED ORDER — DONEPEZIL HCL 5 MG PO TABS
10.0000 mg | ORAL_TABLET | Freq: Every day | ORAL | Status: DC
  Administered 2017-10-22 – 2017-10-25 (×4): 10 mg via ORAL
  Filled 2017-10-22 (×2): qty 2
  Filled 2017-10-22: qty 1
  Filled 2017-10-22: qty 2
  Filled 2017-10-22: qty 1
  Filled 2017-10-22 (×2): qty 2

## 2017-10-22 MED ORDER — ADULT MULTIVITAMIN W/MINERALS CH
1.0000 | ORAL_TABLET | Freq: Every day | ORAL | Status: DC
  Administered 2017-10-22 – 2017-10-25 (×4): 1 via ORAL
  Filled 2017-10-22 (×5): qty 1

## 2017-10-22 MED ORDER — METOPROLOL SUCCINATE ER 25 MG PO TB24
25.0000 mg | ORAL_TABLET | Freq: Every day | ORAL | Status: DC
  Administered 2017-10-22 – 2017-10-24 (×3): 25 mg via ORAL
  Filled 2017-10-22 (×3): qty 1

## 2017-10-22 NOTE — [place] FL2 (Deleted)
[place] MEDICAID FL2 LEVEL OF CARE SCREENING TOOL     IDENTIFICATION  Patient Name: Joseph Pena Birthdate: 01-May-1931 Sex: male Admission Date (Current Location): 10/21/2017  [facility] and [place] Number:  [employer] and Address:  [facility],  [address] [place] [address]      Provider Number: [id number]  Attending Physician Name and Address:  Eddie North, MD  Relative Name and Phone Number:       Current Level of Care: Other (Comment)(observation) Recommended Level of Care: Assisted Living Facility Prior Approval Number:    Date Approved/Denied:   PASRR Number:    Discharge Plan: Other (Comment)(ALF)    Current Diagnoses: Patient Active Problem List   Diagnosis Date Noted  . Near syncope 10/21/2017  . Alzheimer disease 10/21/2017  . CKD (chronic kidney disease) stage 3, GFR 30-59 ml/min (HCC) 04/05/2016  . Cellulitis of left upper extremity 04/05/2016  . Elevated troponin 04/05/2016  . SIRS (systemic inflammatory response syndrome) (HCC) 04/04/2016  . CAD (coronary artery disease) 08/15/2013  . HTN (hypertension) 08/15/2013  . Hyperlipidemia 08/15/2013  . Atrial fibrillation (HCC) 12/01/2012  . Long term (current) use of anticoagulants 12/01/2012    Orientation RESPIRATION BLADDER Height & Weight     Self, Place  Normal Continent Weight: 184 lb 1.4 oz (83.5 kg) Height:  5\' 9"  (175.3 cm)  BEHAVIORAL SYMPTOMS/MOOD NEUROLOGICAL BOWEL NUTRITION STATUS      Continent Diet(Heart Healthy)  AMBULATORY STATUS COMMUNICATION OF NEEDS Skin   Extensive Assist Verbally Normal                       Personal Care Assistance Level of Assistance  Bathing, Feeding, Dressing Bathing Assistance: Limited assistance Feeding assistance: Independent Dressing Assistance: Limited assistance     Functional Limitations Info  Sight, Hearing, Speech Sight Info: Adequate Hearing Info: Adequate Speech Info: Adequate    SPECIAL CARE  FACTORS FREQUENCY  PT (By licensed PT)     PT Frequency: 3x/week              Contractures Contractures Info: Not present    Additional Factors Info  Code Status, Allergies, Psychotropic Code Status Info: Full Code Allergies Info: NKA Psychotropic Info: Lexapro         Current Medications (10/22/2017):  This is the current hospital active medication list Current Facility-Administered Medications  Medication Dose Route Frequency Provider Last Rate Last Dose  . 0.9 %  sodium chloride infusion   Intravenous Continuous Dhungel, Nishant, MD 75 mL/hr at 10/22/17 1515    . aspirin EC tablet 81 mg  81 mg Oral Daily Bobette Mo, MD   81 mg at 10/22/17 1117  . atorvastatin (LIPITOR) tablet 20 mg  20 mg Oral Daily Bobette Mo, MD   20 mg at 10/22/17 1117  . donepezil (ARICEPT) tablet 10 mg  10 mg Oral QHS Bobette Mo, MD      . escitalopram [facility]) tablet 5 mg  5 mg Oral Daily Bobette Mo, MD   5 mg at 10/22/17 1117  . metoprolol succinate (TOPROL-XL) 24 hr tablet 25 mg  25 mg Oral Daily Bobette Mo, MD   25 mg at 10/22/17 1117  . multivitamin with minerals tablet 1 tablet  1 tablet Oral QHS Bobette Mo, MD      . Warfarin - Pharmacist Dosing Inpatient   Does not apply [id number] Eddie North, MD  Discharge Medications: Please see discharge summary for a list of discharge medications.  Relevant Imaging Results:  Relevant Lab Results:   Additional Information SSN [id number] 1979  Amelie Caracci, Juleen ChinaHeather D, [place]CS[place]

## 2017-10-22 NOTE — Care Management Obs Status (Signed)
MEDICARE OBSERVATION STATUS NOTIFICATION   Patient Details  Name: Joseph Pena MRN: [id number] Date of Birth: [birth year]   Medicare Observation Status Notification Given:  Yes(discussed with patient, was A&O at the time. left message with wife to discuss with her also.)    Lonna Rabold, Chrystine OilerSharley Diane, RN 10/22/2017, 2:54 PM

## 2017-10-22 NOTE — Progress Notes (Signed)
ANTICOAGULATION CONSULT NOTE - Initial Consult  Pharmacy Consult for warfarin Indication: atrial fibrillation  No Known Allergies  Patient Measurements: Height: 5\' 9"  (175.3 cm) Weight: 184 lb 1.4 oz (83.5 kg) IBW/kg (Calculated) : 70.7   Vital Signs: Temp: 98.2 F (36.8 C) (01/28 0356) Temp Source: Oral (01/28 0356) BP: 126/74 (01/28 0356) Pulse Rate: 83 (01/28 0356)  Labs: Recent Labs    10/21/17 1703 10/22/17 0605  HGB 13.0  --   HCT 39.3  --   PLT 144*  --   LABPROT 30.6* 31.3*  INR 2.97 3.05  CREATININE 1.47* 1.33*    Estimated Creatinine Clearance: 41.3 mL/min (A) (by C-G formula based on SCr of 1.33 mg/dL (H)).   Medical History: Past Medical History:  Diagnosis Date  . Alzheimer disease   . Atrial fibrillation (HCC)   . CAD (coronary artery disease)   . H/O echocardiogram 2013   indications: murmur, EF =50-55%  . H/O echocardiogram 2011   indications: Afib, EF 45-50%  . H/O echocardiogram 2008   atrial fib, EF =>55%  . H/O myocardial perfusion scan 2013   Indications: RBBB, NSSTT  . Mitral regurgitation     Medications:  Medications Prior to Admission  Medication Sig Dispense Refill Last Dose  . aspirin 81 MG tablet Take 81 mg by mouth daily.   10/21/2017 at Unknown time  . atorvastatin (LIPITOR) 20 MG tablet Take 20 mg by mouth daily.   10/21/2017 at Unknown time  . donepezil (ARICEPT) 10 MG tablet Take 10 mg by mouth at bedtime.   10/20/2017 at Unknown time  . escitalopram (LEXAPRO) 10 MG tablet Take 5 mg by mouth daily.    10/21/2017 at Unknown time  . metoprolol succinate (TOPROL-XL) 25 MG 24 hr tablet TAKE 1 TABLET DAILY (Patient taking differently: Take 25 mg by mouth daily. ) 30 tablet 11 10/21/2017 at Unknown time  . Multiple Vitamin (MULTIVITAMIN) capsule Take 1 capsule by mouth at bedtime.    10/20/2017 at Unknown time  . warfarin (COUMADIN) 5 MG tablet Take 1 and 1/2 tablets once daily or as directed (Patient taking differently: Take 7.5 mg  by mouth every morning. ) 45 tablet 3 10/21/2017 at 900a    Assessment: Patient's INR is barely supratherapeutic at 3.05. Will give warfarin at reduced dose compared to home regimen.  Goal of Therapy:  INR 2-3 Monitor platelets by anticoagulation protocol: Yes   Plan:  Warfarin 5 mg x 1 dose INR daily and monitor for s/s of bleeding   Judeth CornfieldSteven Dorethia Jeanmarie, PharmD Clinical Pharmacist 10/22/2017 10:57 AM

## 2017-10-22 NOTE — Plan of Care (Signed)
  Acute Rehab PT Goals(only PT should resolve) Pt Will Go Supine/Side To Sit 10/22/2017 1332 - Progressing by Ocie BobWatkins, Rakiya Krawczyk, PT Flowsheets Taken 10/22/2017 1332  Pt will go Supine/Side to Sit with min guard assist Patient Will Transfer Sit To/From Stand 10/22/2017 1332 - Progressing by Ocie BobWatkins, Chadwick Reiswig, PT Flowsheets Taken 10/22/2017 1332  Patient will transfer sit to/from stand with min guard assist Pt Will Transfer Bed To Chair/Chair To Bed 10/22/2017 1332 - Progressing by Ocie BobWatkins, Rosella Crandell, PT Flowsheets Taken 10/22/2017 1332  Pt will Transfer Bed to Chair/Chair to Bed min guard assist Pt Will Ambulate 10/22/2017 1332 - Progressing by Ocie BobWatkins, Sarya Linenberger, PT Flowsheets Taken 10/22/2017 1332  Pt will Ambulate with minimal assist;25 feet;with rolling walker  1:33 PM, 10/22/17 Ocie BobJames Alaijah Gibler, MPT Physical Therapist with [facility] [phone] office [phone] mobile phone

## 2017-10-22 NOTE — Progress Notes (Signed)
PROGRESS NOTE                                                                                                                                                                                                             Patient Demographics:    Joseph Pena, is a 84 y.o. male, DOB - 1931/07/28, WUJ:[id number]  Admit date - 10/21/2017   Admitting Physician Bobette Moavid Manuel Ortiz, MD  Outpatient Primary MD for the patient is Samuel JesterButler, Cynthia, DO  LOS - 0  Outpatient Specialists: None  Chief Complaint  Patient presents with  . Fall       Brief Narrative   84 year old male with history of Alzheimer's dementia, A. fib on Coumadin, coronary artery disease, mitral regurgitation and hyperlipidemia brought from home by his wife with frequent falls for the past 2-3 days. Patient had almost 4 episodes a fall in the past 2 days and normally uses a walker. Patient lives with his wife who is his primary caregiver and has difficulty helping him with transfers. In the ED vitals were stable. Initial blood work showed INR of 2.97, mild AKI with BUN of 20 and creatinine 1.47. CT head showed chronic small vessel ischemic changes and old lacunar infarct in the left pons and bilateral basal ganglia without acute stroke. Patient given IV fluid bolus and placed on observation.  MRI brain/MRA head done this morning showing acute-subacute nonhemorrhagic infarct over left parietal lobe.   Subjective:    Patient knows he is in the hospital but is unable to communicate further and is very confused   Assessment  & Plan :    Principal Problem: Acute ischemic stroke [facility](HCC) Has acute/subacute stroke of the left parietal lobe which is likely contributed to his frequent falls recently. Continue aspirin, increase Lipitor dose to 80 mg daily. Check lipid panel in the morning. Patient on Coumadin for A. fib. Allow permissive blood pressure. Neurology  consult. ( will see pt in AM).  Active Problems:   Atrial fibrillation (CHADS2vasc of 6 ) (HCC) Rate controlled. On ASA and coumadin. continue BB  Acute kidney injury Mild. Monitor with gentle hydration.    Hyperlipidemia Check lipid panel in am. increase lipitor to 80 mg daily    Alzheimer disease with dementia Continue aricept      Code Status : Full code   Family Communication  :  will update wife  Disposition Plan  :  SNF per PT, likely tomorrow once workup completed and neurology evaluation  Barriers For Discharge : Active symptoms  Consults  : Neurology (see in the morning)  Procedures  : Head CT MRI brain Carotid ultrasound 2-D echo    DVT Prophylaxis  :  Coumadin  Lab Results  Component Value Date   PLT 144 (L) 10/21/2017    Antibiotics  :    Anti-infectives (From admission, onward)   None        Objective:   Vitals:   10/21/17 2349 10/22/17 0000 10/22/17 0209 10/22/17 0356  BP: (!) 148/66 (!) 148/85 134/69 126/74  Pulse: 67 98 70 83  Resp: 18 16 19 18   Temp:    98.2 F (36.8 C)  TempSrc:    Oral  SpO2: 98% 98% 96% 100%  Weight:    83.5 kg (184 lb 1.4 oz)  Height:    5\' 9"  (1.753 m)    Wt Readings from Last 3 Encounters:  10/22/17 83.5 kg (184 lb 1.4 oz)  11/24/16 82.1 kg (181 lb)  04/04/16 80.9 kg (178 lb 4.8 oz)     Intake/Output Summary (Last 24 hours) at 10/22/2017 1352 Last data filed at 10/21/2017 2144 Gross per 24 hour  Intake 1000 ml  Output -  Net 1000 ml     Physical Exam  Gen: not in distress, confused HEENT: moist mucosa, supple neck Chest: clear b/l, no added sounds CVS: S1 and S2 irregular, no murmurs GI: soft, NT, ND, BS+ Musculoskeletal: warm, no edema CNS: AAO 1, confused, no focal deficit    Data Review:    CBC Recent Labs  Lab 10/21/17 1703  WBC 9.6  HGB 13.0  HCT 39.3  PLT 144*  MCV 98.0  MCH 32.4  MCHC 33.1  RDW 12.8    Chemistries  Recent Labs  Lab 10/21/17 1703 10/22/17 0605   NA 141 140  K 4.4 4.3  CL 106 105  CO2 26 26  GLUCOSE 161* 96  BUN 28* 21*  CREATININE 1.47* 1.33*  CALCIUM 9.3 8.8*  MG 1.9  --   AST 23  --   ALT 18  --   ALKPHOS 55  --   BILITOT 0.7  --    ------------------------------------------------------------------------------------------------------------------ No results for input(s): CHOL, HDL, LDLCALC, TRIG, CHOLHDL, LDLDIRECT in the last 72 hours.  No results found for: HGBA1C ------------------------------------------------------------------------------------------------------------------ No results for input(s): TSH, T4TOTAL, T3FREE, THYROIDAB in the last 72 hours.  Invalid input(s): FREET3 ------------------------------------------------------------------------------------------------------------------ No results for input(s): VITAMINB12, FOLATE, FERRITIN, TIBC, IRON, RETICCTPCT in the last 72 hours.  Coagulation profile Recent Labs  Lab 10/21/17 1703 10/22/17 0605  INR 2.97 3.05    No results for input(s): DDIMER in the last 72 hours.  Cardiac Enzymes No results for input(s): CKMB, TROPONINI, MYOGLOBIN in the last 168 hours.  Invalid input(s): CK ------------------------------------------------------------------------------------------------------------------ No results found for: BNP  Inpatient Medications  Scheduled Meds: . aspirin EC  81 mg Oral Daily  . atorvastatin  20 mg Oral Daily  . donepezil  10 mg Oral QHS  . escitalopram  5 mg Oral Daily  . metoprolol succinate  25 mg Oral Daily  . multivitamin with minerals  1 tablet Oral QHS  . warfarin  5 mg Oral Once  . Warfarin - Pharmacist Dosing Inpatient   Does not apply q1800   Continuous Infusions: PRN Meds:.  Micro Results No results found for this or any previous visit (  from the past 240 hour(s)).  Radiology Reports Ct Head Wo Contrast  Result Date: 10/21/2017 CLINICAL DATA:  Frequent falls at home, minor head trauma, history Alzheimer's,  atrial fibrillation, coronary artery disease, hypertension EXAM: CT HEAD WITHOUT CONTRAST TECHNIQUE: Contiguous axial images were obtained from the base of the skull through the vertex without intravenous contrast. Sagittal and coronal MPR images reconstructed from axial data set. COMPARISON:  04/20/2017 FINDINGS: Brain: Generalized atrophy. Normal ventricular morphology. No midline shift or mass effect. Small vessel chronic ischemic changes of deep cerebral white matter. Old lacunar infarcts in BILATERAL basal ganglia and LEFT pons. No intracranial hemorrhage, mass lesion or evidence of acute infarction. No extra-axial fluid collections. Vascular: Atherosclerotic calcifications of internal carotid and vertebral arteries at skull base Skull: Intact Sinuses/Orbits: Clear Other: N/A IMPRESSION: Atrophy with small vessel chronic ischemic changes of deep cerebral white matter. Old lacunar infarcts in LEFT pons and BILATERAL basal ganglia. No acute intracranial abnormalities. Electronically Signed   By: Ulyses Southward M.D.   On: 10/21/2017 18:03   Mr Maxine Glenn Head Wo Contrast  Result Date: 10/22/2017 CLINICAL DATA:  Ataxia. Stroke suspected. Weakness. Multiple recent falls. EXAM: MRI HEAD WITHOUT CONTRAST MRA HEAD WITHOUT CONTRAST TECHNIQUE: Multiplanar, multiecho pulse sequences of the brain and surrounding structures were obtained without intravenous contrast. Angiographic images of the head were obtained using MRA technique without contrast. COMPARISON:  CT head without contrast 10/21/2016 FINDINGS: MRI HEAD FINDINGS Brain: The diffusion-weighted images demonstrate acute/subacute non hemorrhagic infarcts in the left parietal white matter adjacent to the atrium of the left lateral ventricle. There is also involvement of the posterior insular cortex. Associated T2 signal changes are consistent with a subacute time frame. Advanced atrophy is present. Multiple remote lacunar infarcts are present in the basal ganglia  bilaterally. Relatively minimal white matter change is present otherwise. Remote lacunar infarcts are present within the pons. Remote lacunar infarcts are present in the left greater than right cerebellum. Vascular: Flow is present in the major intracranial arteries. Skull and upper cervical spine: The skull base is within normal limits. The craniocervical junction is normal. A benign appearing pineal cyst is stable, measuring up to 12 mm. No significant solid tissue components are present. Marrow signal is normal. The upper cervical spine is unremarkable. Sinuses/Orbits: The paranasal sinuses and mastoid air cells are clear. Globes and orbits are within normal limits. MRA HEAD FINDINGS Time-of-flight images are somewhat degraded by patient motion. The internal carotid arteries are within normal limits from the high cervical segments through the ICA termini. Tortuosity of the cervical left ICA is likely related to hypertension. There is no significant stenosis. The A1 segments and left M1 segments are normal. Signal loss of the right M1 segment is felt to be artifactual. The MCA bifurcations are intact. There is asymmetric attenuation of left MCA branch vessels, exaggerated by artifact. Moderate right A2 segment stenosis is present. The right vertebral artery is dominant. The right PICA is visualized and normal. The left PICA is not visualized. The basilar artery is normal. There is significant signal loss in the proximal posterior cerebral arteries bilaterally. IMPRESSION: 1. Acute/subacute nonhemorrhagic white matter infarcts involving the left parietal lobe. 2. Advanced atrophy. 3. Multiple remote lacunar infarcts involving the basal ganglia, brainstem, and cerebellum. 4. No significant proximal stenosis, aneurysm, or occlusion within the anterior circulation. 5. Moderate diffuse small vessel disease is exaggerated by patient motion. 6. Question proximal PCA stenoses bilaterally. These results will be called to  the ordering clinician or representative by  the Radiologist Assistant, and communication documented in the PACS or zVision Dashboard. Electronically Signed   By: Marin Roberts M.D.   On: 10/22/2017 11:09   Mr Brain Wo Contrast  Result Date: 10/22/2017 CLINICAL DATA:  Ataxia. Stroke suspected. Weakness. Multiple recent falls. EXAM: MRI HEAD WITHOUT CONTRAST MRA HEAD WITHOUT CONTRAST TECHNIQUE: Multiplanar, multiecho pulse sequences of the brain and surrounding structures were obtained without intravenous contrast. Angiographic images of the head were obtained using MRA technique without contrast. COMPARISON:  CT head without contrast 10/21/2016 FINDINGS: MRI HEAD FINDINGS Brain: The diffusion-weighted images demonstrate acute/subacute non hemorrhagic infarcts in the left parietal white matter adjacent to the atrium of the left lateral ventricle. There is also involvement of the posterior insular cortex. Associated T2 signal changes are consistent with a subacute time frame. Advanced atrophy is present. Multiple remote lacunar infarcts are present in the basal ganglia bilaterally. Relatively minimal white matter change is present otherwise. Remote lacunar infarcts are present within the pons. Remote lacunar infarcts are present in the left greater than right cerebellum. Vascular: Flow is present in the major intracranial arteries. Skull and upper cervical spine: The skull base is within normal limits. The craniocervical junction is normal. A benign appearing pineal cyst is stable, measuring up to 12 mm. No significant solid tissue components are present. Marrow signal is normal. The upper cervical spine is unremarkable. Sinuses/Orbits: The paranasal sinuses and mastoid air cells are clear. Globes and orbits are within normal limits. MRA HEAD FINDINGS Time-of-flight images are somewhat degraded by patient motion. The internal carotid arteries are within normal limits from the high cervical segments through  the ICA termini. Tortuosity of the cervical left ICA is likely related to hypertension. There is no significant stenosis. The A1 segments and left M1 segments are normal. Signal loss of the right M1 segment is felt to be artifactual. The MCA bifurcations are intact. There is asymmetric attenuation of left MCA branch vessels, exaggerated by artifact. Moderate right A2 segment stenosis is present. The right vertebral artery is dominant. The right PICA is visualized and normal. The left PICA is not visualized. The basilar artery is normal. There is significant signal loss in the proximal posterior cerebral arteries bilaterally. IMPRESSION: 1. Acute/subacute nonhemorrhagic white matter infarcts involving the left parietal lobe. 2. Advanced atrophy. 3. Multiple remote lacunar infarcts involving the basal ganglia, brainstem, and cerebellum. 4. No significant proximal stenosis, aneurysm, or occlusion within the anterior circulation. 5. Moderate diffuse small vessel disease is exaggerated by patient motion. 6. Question proximal PCA stenoses bilaterally. These results will be called to the ordering clinician or representative by the Radiologist Assistant, and communication documented in the PACS or zVision Dashboard. Electronically Signed   By: Marin Roberts M.D.   On: 10/22/2017 11:09   US Carotid Bilateral  Result Date: 10/22/2017 CLINICAL DATA:  Near syncopal episode. History of CAD (post CABG) and hyperlipidemia. EXAM: BILATERAL CAROTID DUPLEX ULTRASOUND TECHNIQUE: Wallace Cullens scale imaging, color Doppler and duplex ultrasound were performed of bilateral carotid and vertebral arteries in the neck. COMPARISON:  None. FINDINGS: Criteria: Quantification of carotid stenosis is based on velocity parameters that correlate the residual internal carotid diameter with NASCET-based stenosis levels, using the diameter of the distal internal carotid lumen as the denominator for stenosis measurement. The following velocity  measurements were obtained: RIGHT ICA:  94/26 cm/sec CCA:  94/18 cm/sec SYSTOLIC ICA/CCA RATIO:  1.0 DIASTOLIC ICA/CCA RATIO:  1.5 ECA:  85 cm/sec LEFT ICA:  70/20 cm/sec CCA:  75/10 cm/sec SYSTOLIC  ICA/CCA RATIO:  0.9 DIASTOLIC ICA/CCA RATIO:  1.9 ECA:  72 cm/sec RIGHT CAROTID ARTERY: There is a minimal amount of echogenic plaque within the right carotid bulb (image 16). There is a minimal to moderate amount of echogenic plaque involving the origin and proximal aspects of the right internal carotid artery (image 24), not resulting in elevated peak systolic velocities within the interrogated course the right internal carotid artery to suggest a hemodynamically significant stenosis. RIGHT VERTEBRAL ARTERY:  Antegrade flow LEFT CAROTID ARTERY: There is a moderate amount of mixed echogenic plaque within the left carotid bulb (images 49 and 50), not resulting in elevated peak systolic velocities within the interrogated course the left internal carotid artery to suggest a hemodynamically significant stenosis. LEFT VERTEBRAL ARTERY:  Antegrade flow IMPRESSION: Minimal to moderate amount of bilateral atherosclerotic plaque, not resulting in a hemodynamically significant stenosis within either internal carotid artery. Electronically Signed   By: Simonne Come M.D.   On: 10/22/2017 11:23    Time Spent in minutes  25   Nathaniel Yaden M.D on 10/22/2017 at 1:52 PM  Between 7am to 7pm - Pager - [phone]  After 7pm go to [email or web address] - password [facility]  [facility] -  Office  [phone]

## 2017-10-22 NOTE — Plan of Care (Signed)
Patient has had an uneventful night. He has been voiding appropriately. He was bathed this a.m. He denies pain. All high fall risk safety precautions remain in place.

## 2017-10-22 NOTE — Evaluation (Signed)
Physical Therapy Evaluation Patient Details Name: Joseph Pena MRN: [id number] DOB: 1931/01/23 Today's Date: 10/22/2017   History of Present Illness  Joseph Pena is a 84 y.o. male with medical history significant of Alzheimer's disease, atrial fibrillation, CAD, mitral regurgitation, hyperlipidemia who is brought to the emergency department via EMS due to having frequent falls at home for the past 2-3 days.    Clinical Impression  Patient demonstrates slow labored movement for sitting up at bedside and limited to a few side steps using RW due to BLE weakness and c/o fatigue.  Patient tolerated sitting up in chair after therapy.  Patient will benefit from continued physical therapy in hospital and recommended venue below to increase strength, balance, endurance for safe ADLs and gait.    Follow Up Recommendations SNF    Equipment Recommendations  None recommended by PT    Recommendations for Other Services       Precautions / Restrictions Precautions Precautions: Fall Restrictions Weight Bearing Restrictions: No      Mobility  Bed Mobility Overal bed mobility: Needs Assistance Bed Mobility: Supine to Sit     Supine to sit: Min assist;Mod assist     General bed mobility comments: demonstrates labored movement  Transfers Overall transfer level: Needs assistance Equipment used: Rolling walker (2 wheeled) Transfers: Sit to/from UGI CorporationStand;Stand Pivot Transfers Sit to Stand: Min assist;Mod assist Stand pivot transfers: Mod assist       General transfer comment: slow labored movement  Ambulation/Gait Ambulation/Gait assistance: Mod assist;Max assist Ambulation Distance (Feet): 4 Feet Assistive device: Rolling walker (2 wheeled) Gait Pattern/deviations: Decreased step length - right;Decreased step length - left;Decreased stride length   Gait velocity interpretation: Below normal speed for age/gender General Gait Details: limited to 5-6 unsteady side steps due to  BLE weakness and c/o fatigue  Stairs            Wheelchair Mobility    Modified Rankin (Stroke Patients Only)       Balance Overall balance assessment: Needs assistance Sitting-balance support: No upper extremity supported;Feet supported Sitting balance-Leahy Scale: Good     Standing balance support: Bilateral upper extremity supported;During functional activity Standing balance-Leahy Scale: Poor Standing balance comment: fair/poor with RW                             Pertinent Vitals/Pain Pain Assessment: No/denies pain    Home Living Family/patient expects to be discharged to:: Private residence Living Arrangements: Spouse/significant other Available Help at Discharge: Family Type of Home: House Home Access: Level entry     Home Layout: Two level Home Equipment: Cane - single point;Walker - 2 wheels;Shower seat;Wheelchair - manual      Prior Function Level of Independence: Independent with assistive device(s)         Comments: ambulates with SPC     Hand Dominance        Extremity/Trunk Assessment   Upper Extremity Assessment Upper Extremity Assessment: Generalized weakness    Lower Extremity Assessment Lower Extremity Assessment: Generalized weakness    Cervical / Trunk Assessment Cervical / Trunk Assessment: Normal  Communication   Communication: No difficulties  Cognition Arousal/Alertness: Awake/alert Behavior During Therapy: WFL for tasks assessed/performed Overall Cognitive Status: Within Functional Limits for tasks assessed  General Comments      Exercises     Assessment/Plan    PT Assessment Patient needs continued PT services  PT Problem List Decreased strength;Decreased activity tolerance;Decreased balance;Decreased mobility       PT Treatment Interventions Gait training;Functional mobility training;Therapeutic activities;Therapeutic  exercise;Patient/family education    PT Goals (Current goals can be found in the Care Plan section)  Acute Rehab PT Goals Patient Stated Goal: return home after rehab PT Goal Formulation: With patient Time For Goal Achievement: 10/29/17 Potential to Achieve Goals: Good    Frequency Min 3X/week   Barriers to discharge        Co-evaluation               AM-PAC PT "6 Clicks" Daily Activity  Outcome Measure Difficulty turning over in bed (including adjusting bedclothes, sheets and blankets)?: A Little Difficulty moving from lying on back to sitting on the side of the bed? : A Lot Difficulty sitting down on and standing up from a chair with arms (e.g., wheelchair, bedside commode, etc,.)?: A Lot Help needed moving to and from a bed to chair (including a wheelchair)?: A Lot Help needed walking in hospital room?: A Lot Help needed climbing 3-5 steps with a railing? : A Lot 6 Click Score: 13    End of Session   Activity Tolerance: Patient limited by fatigue;Patient tolerated treatment well Patient left: in chair;with call bell/phone within reach Nurse Communication: Mobility status PT Visit Diagnosis: Unsteadiness on feet (R26.81);Other abnormalities of gait and mobility (R26.89);Muscle weakness (generalized) (M62.81)    Time: 5784-6962 PT Time Calculation (min) (ACUTE ONLY): 20 min   Charges:   PT Evaluation $PT Eval Moderate Complexity: 1 Mod PT Treatments $Neuromuscular Re-education: 8-22 mins   PT G Codes:        1:29 PM, 2017/11/01 Ocie Bob, MPT Physical Therapist with [facility] [phone] office [phone] mobile phone

## 2017-10-23 ENCOUNTER — Inpatient Hospital Stay (HOSPITAL_COMMUNITY): Payer: Medicare Other

## 2017-10-23 DIAGNOSIS — I63512 Cerebral infarction due to unspecified occlusion or stenosis of left middle cerebral artery: Secondary | ICD-10-CM | POA: Diagnosis not present

## 2017-10-23 DIAGNOSIS — I10 Essential (primary) hypertension: Secondary | ICD-10-CM | POA: Diagnosis present

## 2017-10-23 DIAGNOSIS — N179 Acute kidney failure, unspecified: Secondary | ICD-10-CM | POA: Diagnosis present

## 2017-10-23 DIAGNOSIS — F0390 Unspecified dementia without behavioral disturbance: Secondary | ICD-10-CM | POA: Diagnosis not present

## 2017-10-23 DIAGNOSIS — Z7901 Long term (current) use of anticoagulants: Secondary | ICD-10-CM | POA: Diagnosis not present

## 2017-10-23 DIAGNOSIS — I340 Nonrheumatic mitral (valve) insufficiency: Secondary | ICD-10-CM | POA: Diagnosis not present

## 2017-10-23 DIAGNOSIS — I083 Combined rheumatic disorders of mitral, aortic and tricuspid valves: Secondary | ICD-10-CM | POA: Diagnosis present

## 2017-10-23 DIAGNOSIS — I639 Cerebral infarction, unspecified: Secondary | ICD-10-CM | POA: Diagnosis not present

## 2017-10-23 DIAGNOSIS — Z7982 Long term (current) use of aspirin: Secondary | ICD-10-CM | POA: Diagnosis not present

## 2017-10-23 DIAGNOSIS — R471 Dysarthria and anarthria: Secondary | ICD-10-CM | POA: Diagnosis present

## 2017-10-23 DIAGNOSIS — G301 Alzheimer's disease with late onset: Secondary | ICD-10-CM | POA: Diagnosis not present

## 2017-10-23 DIAGNOSIS — I480 Paroxysmal atrial fibrillation: Secondary | ICD-10-CM | POA: Diagnosis not present

## 2017-10-23 DIAGNOSIS — I4891 Unspecified atrial fibrillation: Secondary | ICD-10-CM | POA: Diagnosis present

## 2017-10-23 DIAGNOSIS — Z951 Presence of aortocoronary bypass graft: Secondary | ICD-10-CM | POA: Diagnosis not present

## 2017-10-23 DIAGNOSIS — A09 Infectious gastroenteritis and colitis, unspecified: Secondary | ICD-10-CM | POA: Diagnosis not present

## 2017-10-23 DIAGNOSIS — F0280 Dementia in other diseases classified elsewhere without behavioral disturbance: Secondary | ICD-10-CM | POA: Diagnosis present

## 2017-10-23 DIAGNOSIS — R197 Diarrhea, unspecified: Secondary | ICD-10-CM | POA: Diagnosis not present

## 2017-10-23 DIAGNOSIS — I63412 Cerebral infarction due to embolism of left middle cerebral artery: Secondary | ICD-10-CM | POA: Diagnosis present

## 2017-10-23 DIAGNOSIS — Z8673 Personal history of transient ischemic attack (TIA), and cerebral infarction without residual deficits: Secondary | ICD-10-CM | POA: Diagnosis not present

## 2017-10-23 DIAGNOSIS — E785 Hyperlipidemia, unspecified: Secondary | ICD-10-CM | POA: Diagnosis present

## 2017-10-23 DIAGNOSIS — Z96653 Presence of artificial knee joint, bilateral: Secondary | ICD-10-CM | POA: Diagnosis present

## 2017-10-23 DIAGNOSIS — Z87891 Personal history of nicotine dependence: Secondary | ICD-10-CM | POA: Diagnosis not present

## 2017-10-23 DIAGNOSIS — R296 Repeated falls: Secondary | ICD-10-CM | POA: Diagnosis present

## 2017-10-23 DIAGNOSIS — R509 Fever, unspecified: Secondary | ICD-10-CM | POA: Diagnosis not present

## 2017-10-23 DIAGNOSIS — R29704 NIHSS score 4: Secondary | ICD-10-CM | POA: Diagnosis present

## 2017-10-23 DIAGNOSIS — G309 Alzheimer's disease, unspecified: Secondary | ICD-10-CM | POA: Diagnosis not present

## 2017-10-23 DIAGNOSIS — R269 Unspecified abnormalities of gait and mobility: Secondary | ICD-10-CM | POA: Diagnosis present

## 2017-10-23 DIAGNOSIS — Z79899 Other long term (current) drug therapy: Secondary | ICD-10-CM | POA: Diagnosis not present

## 2017-10-23 DIAGNOSIS — R55 Syncope and collapse: Secondary | ICD-10-CM | POA: Diagnosis not present

## 2017-10-23 DIAGNOSIS — I2510 Atherosclerotic heart disease of native coronary artery without angina pectoris: Secondary | ICD-10-CM | POA: Diagnosis present

## 2017-10-23 LAB — LIPID PANEL
CHOLESTEROL: 137 mg/dL (ref 0–200)
HDL: 33 mg/dL — ABNORMAL LOW (ref >40)
LDL CALC: 80 mg/dL (ref 0–99)
TRIGLYCERIDES: 119 mg/dL (ref <150)
Total CHOL/HDL Ratio: 4.2 RATIO
VLDL: 24 mg/dL (ref 0–40)

## 2017-10-23 LAB — ECHOCARDIOGRAM COMPLETE
HEIGHTINCHES: 69 in
Weight: 2945.35 oz

## 2017-10-23 LAB — HEMOGLOBIN A1C
Hgb A1c MFr Bld: 5.5 % (ref 4.8–5.6)
MEAN PLASMA GLUCOSE: 111.15 mg/dL

## 2017-10-23 LAB — PROTIME-INR
INR: 2.97
PROTHROMBIN TIME: 30.7 s — AB (ref 11.4–15.2)

## 2017-10-23 MED ORDER — APIXABAN 5 MG PO TABS: 5.0000 mg | ORAL_TABLET | Freq: Two times a day (BID) | ORAL | Status: DC

## 2017-10-23 MED ORDER — ATORVASTATIN CALCIUM 40 MG PO TABS
40.0000 mg | ORAL_TABLET | Freq: Every day | ORAL | Status: DC
  Administered 2017-10-24 – 2017-10-26 (×3): 40 mg via ORAL
  Filled 2017-10-23 (×3): qty 1

## 2017-10-23 MED ORDER — WARFARIN SODIUM 5 MG PO TABS: 5.0000 mg | ORAL_TABLET | Freq: Once | ORAL | Status: DC

## 2017-10-23 NOTE — Clinical Social Work Note (Signed)
Clinical Social Work Assessment  Patient Details  Name: Joseph Pena MRN: [id number] Date of Birth: [birth year]  Date of referral:  10/22/17               Reason for consult:  Facility Placement                Permission sought to share information with:    Permission granted to share information::     Name::        Agency::     Relationship::     Contact Information:  wife, Joseph Pena and daughter Joseph Pena  Housing/Transportation Living arrangements for the past 2 months:  Single Family Home Source of Information:  Patient, Adult Children, Spouse Patient Interpreter Needed:  None Criminal Activity/Legal Involvement Pertinent to Current Situation/Hospitalization:  No - Comment as needed Significant Relationships:  Adult Children, Spouse Lives with:  Spouse Do you feel safe going back to the place where you live?  Yes Need for family participation in patient care:  Yes (Comment)  Care giving concerns:  Joseph Pena reports that it is increasingly difficult to care for patient due to him experiencing multiple falls lately.    Social Worker assessment / plan:  At baseline patient uses a IT sales professionaltransport chair.  He has experienced multiple falls and recently fell 4-5 times within a two day time period. Joseph Pena states that until patient is able to ambulate, she feel that she can only keep him at home if he agrees to stay in the bed. Family is interested in ALF at [facility] [place].   Employment status:  Retired Health and safety inspectornsurance information:  Medicare PT Recommendations:  Skilled Nursing Facility Information / Referral to community resources:  Skilled Nursing Facility  Patient/Family's Response to care:  Family is interested in ALF.  Patient/Family's Understanding of and Emotional Response to Diagnosis, Current Treatment, and Prognosis:  Patient/family understand patient's diagnosis treatment and prognosis and feel that patient may need the support from an ALF at this time.   Emotional  Assessment Appearance:  Appears stated age Attitude/Demeanor/Rapport:    Affect (typically observed):  Accepting Orientation:  Oriented to Self, Oriented to Place Alcohol / Substance use:    Psych involvement (Current and /or in the community):  No (Comment)  Discharge Needs  Concerns to be addressed:  Discharge Planning Concerns Readmission within the last 30 days:  No Current discharge risk:  None Barriers to Discharge:  No Barriers Identified   Annice NeedySettle, Emmelina Mcloughlin D, LCSW 10/23/2017, 8:34 AM

## 2017-10-23 NOTE — Progress Notes (Addendum)
ANTICOAGULATION CONSULT NOTE  Pharmacy Consult for Apixaban Indication: atrial fibrillation  No Known Allergies  Patient Measurements: Height: 5\' 9"  (175.3 cm) Weight: 184 lb 1.4 oz (83.5 kg) IBW/kg (Calculated) : 70.7   Vital Signs: Temp: 97.5 F (36.4 C) (01/29 0422) Temp Source: Oral (01/29 0422) BP: 157/87 (01/29 0422) Pulse Rate: 92 (01/29 0422)  Labs: Recent Labs    10/21/17 1703 10/22/17 0605 10/23/17 0602  HGB 13.0  --   --   HCT 39.3  --   --   PLT 144*  --   --   LABPROT 30.6* 31.3* 30.7*  INR 2.97 3.05 2.97  CREATININE 1.47* 1.33*  --     Estimated Creatinine Clearance: 41.3 mL/min (A) (by C-G formula based on SCr of 1.33 mg/dL (H)).  Assessment: Patient's INR is therapeutic at 2.97 from previous warfarin therapy.   Goal of Therapy:  Systemic Anticoagulation.    Plan:  Daily PT/INR Begin Apixaban when INR < 2.0.  Mady GemmaHayes, Jennessa Trigo R, [facility] 10/23/2017 8:50 AM

## 2017-10-23 NOTE — Progress Notes (Signed)
Physical Therapy Treatment Patient Details Name: Joseph Pena MRN: [id number] DOB: Mar 15, 1931 Today's Date: 10/23/2017    History of Present Illness Joseph Pena is a 84 y.o. male with medical history significant of Alzheimer's disease, atrial fibrillation, CAD, mitral regurgitation, hyperlipidemia who is brought to the emergency department via EMS due to having frequent falls at home for the past 2-3 days.    PT Comments    Patient presents up in chair, agreeable for therapy and demonstrates poor standing balance, limited for taking steps with RLE due to weakness, tolerated sitting up at bedside to complete BLE rom/strengthening exercises with frequent leaning over to the right and unable to hold RUE against gravity due to weakness.  Patient will benefit from continued physical therapy in hospital and recommended venue below to increase strength, balance, endurance for safe ADLs and gait.   Follow Up Recommendations  SNF     Equipment Recommendations  None recommended by PT    Recommendations for Other Services       Precautions / Restrictions Precautions Precautions: Fall Restrictions Weight Bearing Restrictions: No    Mobility  Bed Mobility Overal bed mobility: Needs Assistance Bed Mobility: Supine to Sit;Sit to Supine     Supine to sit: Min assist Sit to supine: Min assist;Mod assist   General bed mobility comments: limited use of RUE due to weakness, leans to the right  Transfers Overall transfer level: Needs assistance Equipment used: Rolling walker (2 wheeled) Transfers: Sit to/from [employer] Sit to Stand: Min assist;Mod assist Stand pivot transfers: Mod assist       General transfer comment: difficulty moving RLE due to weakness  Ambulation/Gait Ambulation/Gait assistance: Mod assist;Max assist Ambulation Distance (Feet): 4 Feet Assistive device: Rolling walker (2 wheeled) Gait Pattern/deviations: Decreased step length -  right;Decreased step length - left;Decreased stride length   Gait velocity interpretation: Below normal speed for age/gender General Gait Details: has difficulty maintaining standing balance,/taking steps with RLE due to weakness   Stairs            Wheelchair Mobility    Modified Rankin (Stroke Patients Only)       Balance Overall balance assessment: Needs assistance Sitting-balance support: Bilateral upper extremity supported;Feet supported Sitting balance-Leahy Scale: Poor Sitting balance - Comments: frequent leaning to the right and falling backwards Postural control: Right lateral lean Standing balance support: Bilateral upper extremity supported;During functional activity Standing balance-Leahy Scale: Poor Standing balance comment: fair/poor with RW                            Cognition Arousal/Alertness: Awake/alert Behavior During Therapy: WFL for tasks assessed/performed Overall Cognitive Status: Impaired/Different from baseline Area of Impairment: Memory                     Memory: Decreased short-term memory         General Comments: On patient occasional patient appears slightly confused, but can converse appropriately most of time      Exercises General Exercises - Lower Extremity Long Arc Quad: Seated;AROM;Strengthening;Both;10 reps Hip Flexion/Marching: Seated;AROM;Strengthening;Both;10 reps Toe Raises: Seated;AROM;Strengthening;Both;10 reps Heel Raises: Seated;AROM;Strengthening;10 reps;Both    General Comments        Pertinent Vitals/Pain Pain Assessment: No/denies pain    Home Living                      Prior Function  PT Goals (current goals can now be found in the care plan section) Acute Rehab PT Goals Patient Stated Goal: return home after rehab PT Goal Formulation: With patient Time For Goal Achievement: 10/29/17 Potential to Achieve Goals: Good Progress towards PT goals: Progressing  toward goals    Frequency    7X/week      PT Plan Current plan remains appropriate    Co-evaluation              AM-PAC PT "6 Clicks" Daily Activity  Outcome Measure  Difficulty turning over in bed (including adjusting bedclothes, sheets and blankets)?: A Little Difficulty moving from lying on back to sitting on the side of the bed? : A Lot Difficulty sitting down on and standing up from a chair with arms (e.g., wheelchair, bedside commode, etc,.)?: A Lot Help needed moving to and from a bed to chair (including a wheelchair)?: A Lot Help needed walking in hospital room?: A Lot Help needed climbing 3-5 steps with a railing? : Total 6 Click Score: 12    End of Session   Activity Tolerance: Patient limited by fatigue;Patient tolerated treatment well Patient left: in bed;with call bell/phone within reach;with bed alarm set Nurse Communication: Mobility status PT Visit Diagnosis: Unsteadiness on feet (R26.81);Other abnormalities of gait and mobility (R26.89);Muscle weakness (generalized) (M62.81)     Time: [id number] PT Time Calculation (min) (ACUTE ONLY): 32 min  Charges:  $Therapeutic Exercise: 8-22 mins $Therapeutic Activity: 8-22 mins                    G Codes:       11:10 AM, 10/23/17 Ocie BobJames Aubrei Bouchie, MPT Physical Therapist [phone] office [phone] mobile phone

## 2017-10-23 NOTE — Progress Notes (Signed)
PROGRESS NOTE                                                                                                                                                                                                             Patient Demographics:    Joseph Pena, is a 84 y.o. male, DOB - [birth year], ZOX:[id number]  Admit date - 10/21/2017   Admitting Physician Bobette Mo, MD  Outpatient Primary MD for the patient is Joseph Jester, DO  LOS - 0  Outpatient Specialists: None  Chief Complaint  Patient presents with  . Fall       Brief Narrative   84 year old male with history of Alzheimer's dementia, A. fib on Coumadin, coronary artery disease, mitral regurgitation and hyperlipidemia brought from home by his wife with frequent falls for the past 2-3 days. Patient had almost 4 episodes a fall in the past 2 days and normally uses a walker. Patient lives with his wife who is his primary caregiver and has difficulty helping him with transfers. In the ED vitals were stable. Initial blood work showed INR of 2.97, mild AKI with BUN of 20 and creatinine 1.47. CT head showed chronic small vessel ischemic changes and old lacunar infarct in the left pons and bilateral basal ganglia without acute stroke. Patient given IV fluid bolus and placed on observation.  MRI brain/MRA head done showing acute-subacute nonhemorrhagic infarct over left parietal lobe.   Subjective:   Still quite confused. No overnight events   Assessment  & Plan :    Principal Problem: Acute ischemic stroke [facility]) Has acute/subacute stroke of the left parietal lobe which is likely contributed to his frequent falls recently. Carotid US without significant disease. Echo with normal EF, LVH and severe LA dilatation. Continue aspirin, increase Lipitor dose to 40 mg daily. LDL of 80. Patient on Coumadin for A. fib.  Neurology consult appreciated, recommends  switching aspirin and coumadin to eliquis.   Active Problems:   Atrial fibrillation (CHADS2vasc of 6 ) (HCC) Rate controlled. On ASA and coumadin. Switch to eliquis. continue BB  Acute kidney injury Mild. Improved with gentle hydration.    Hyperlipidemia LDL of 80, increased lipitor to 40 gm daily    Alzheimer disease with dementia Continue aricept      Code Status : Full code   Family Communication  :  unable to reach wife on the phone  Disposition Plan  :  SNF per PT, Family interested in ALF, referral sent  Barriers For Discharge : pending approval from ALF  Consults  : Neurology   Procedures  : Head CT MRI brain Carotid ultrasound 2-D echo    DVT Prophylaxis  :  Coumadin  Lab Results  Component Value Date   PLT 144 (L) 10/21/2017    Antibiotics  :    Anti-infectives (From admission, onward)   None        Objective:   Vitals:   10/22/17 0356 10/22/17 2039 10/23/17 0422 10/23/17 1537  BP: 126/74 131/62 (!) 157/87 138/68  Pulse: 83 65 92 70  Resp: 18 18 18 18   Temp: 98.2 F (36.8 C) 98 F (36.7 C) (!) 97.5 F (36.4 C) 98.3 F (36.8 C)  TempSrc: Oral Oral Oral   SpO2: 100% 100% 100% 98%  Weight: 83.5 kg (184 lb 1.4 oz)     Height: 5\' 9"  (1.753 m)       Wt Readings from Last 3 Encounters:  10/22/17 83.5 kg (184 lb 1.4 oz)  11/24/16 82.1 kg (181 lb)  04/04/16 80.9 kg (178 lb 4.8 oz)     Intake/Output Summary (Last 24 hours) at 10/23/2017 1724 Last data filed at 10/23/2017 1441 Gross per 24 hour  Intake 780 ml  Output 950 ml  Net -170 ml     Physical Exam Gen: NAD HEENT: moist mucosa, supple neck  chest: clear b/l  CVS: S1&S2 irregular, 3/6 systolic murmur GI: soft, NT, ND,  Musculoskeletal: warm, no edema  CNS: alert and awake, confused, non focal    Data Review:    CBC Recent Labs  Lab 10/21/17 1703  WBC 9.6  HGB 13.0  HCT 39.3  PLT 144*  MCV 98.0  MCH 32.4  MCHC 33.1  RDW 12.8    Chemistries  Recent Labs    Lab 10/21/17 1703 10/22/17 0605  NA 141 140  K 4.4 4.3  CL 106 105  CO2 26 26  GLUCOSE 161* 96  BUN 28* 21*  CREATININE 1.47* 1.33*  CALCIUM 9.3 8.8*  MG 1.9  --   AST 23  --   ALT 18  --   ALKPHOS 55  --   BILITOT 0.7  --    ------------------------------------------------------------------------------------------------------------------ Recent Labs    10/23/17 0602  CHOL 137  HDL 33*  LDLCALC 80  TRIG 409  CHOLHDL 4.2    Lab Results  Component Value Date   HGBA1C 5.5 10/23/2017   ------------------------------------------------------------------------------------------------------------------ No results for input(s): TSH, T4TOTAL, T3FREE, THYROIDAB in the last 72 hours.  Invalid input(s): FREET3 ------------------------------------------------------------------------------------------------------------------ No results for input(s): VITAMINB12, FOLATE, FERRITIN, TIBC, IRON, RETICCTPCT in the last 72 hours.  Coagulation profile Recent Labs  Lab 10/21/17 1703 10/22/17 0605 10/23/17 0602  INR 2.97 3.05 2.97    No results for input(s): DDIMER in the last 72 hours.  Cardiac Enzymes No results for input(s): CKMB, TROPONINI, MYOGLOBIN in the last 168 hours.  Invalid input(s): CK ------------------------------------------------------------------------------------------------------------------ No results found for: BNP  Inpatient Medications  Scheduled Meds: . atorvastatin  20 mg Oral Daily  . donepezil  10 mg Oral QHS  . escitalopram  5 mg Oral Daily  . metoprolol succinate  25 mg Oral Daily  . multivitamin with minerals  1 tablet Oral QHS   Continuous Infusions: PRN Meds:.  Micro Results No results found for this or any previous visit (from the past  240 hour(s)).  Radiology Reports Ct Head Wo Contrast  Result Date: 10/21/2017 CLINICAL DATA:  Frequent falls at home, minor head trauma, history Alzheimer's, atrial fibrillation, coronary artery  disease, hypertension EXAM: CT HEAD WITHOUT CONTRAST TECHNIQUE: Contiguous axial images were obtained from the base of the skull through the vertex without intravenous contrast. Sagittal and coronal MPR images reconstructed from axial data set. COMPARISON:  04/20/2017 FINDINGS: Brain: Generalized atrophy. Normal ventricular morphology. No midline shift or mass effect. Small vessel chronic ischemic changes of deep cerebral white matter. Old lacunar infarcts in BILATERAL basal ganglia and LEFT pons. No intracranial hemorrhage, mass lesion or evidence of acute infarction. No extra-axial fluid collections. Vascular: Atherosclerotic calcifications of internal carotid and vertebral arteries at skull base Skull: Intact Sinuses/Orbits: Clear Other: N/A IMPRESSION: Atrophy with small vessel chronic ischemic changes of deep cerebral white matter. Old lacunar infarcts in LEFT pons and BILATERAL basal ganglia. No acute intracranial abnormalities. Electronically Signed   By: Ulyses Southward M.D.   On: 10/21/2017 18:03   Mr Maxine Glenn Head Wo Contrast  Result Date: 10/22/2017 CLINICAL DATA:  Ataxia. Stroke suspected. Weakness. Multiple recent falls. EXAM: MRI HEAD WITHOUT CONTRAST MRA HEAD WITHOUT CONTRAST TECHNIQUE: Multiplanar, multiecho pulse sequences of the brain and surrounding structures were obtained without intravenous contrast. Angiographic images of the head were obtained using MRA technique without contrast. COMPARISON:  CT head without contrast 10/21/2016 FINDINGS: MRI HEAD FINDINGS Brain: The diffusion-weighted images demonstrate acute/subacute non hemorrhagic infarcts in the left parietal white matter adjacent to the atrium of the left lateral ventricle. There is also involvement of the posterior insular cortex. Associated T2 signal changes are consistent with a subacute time frame. Advanced atrophy is present. Multiple remote lacunar infarcts are present in the basal ganglia bilaterally. Relatively minimal white matter  change is present otherwise. Remote lacunar infarcts are present within the pons. Remote lacunar infarcts are present in the left greater than right cerebellum. Vascular: Flow is present in the major intracranial arteries. Skull and upper cervical spine: The skull base is within normal limits. The craniocervical junction is normal. A benign appearing pineal cyst is stable, measuring up to 12 mm. No significant solid tissue components are present. Marrow signal is normal. The upper cervical spine is unremarkable. Sinuses/Orbits: The paranasal sinuses and mastoid air cells are clear. Globes and orbits are within normal limits. MRA HEAD FINDINGS Time-of-flight images are somewhat degraded by patient motion. The internal carotid arteries are within normal limits from the high cervical segments through the ICA termini. Tortuosity of the cervical left ICA is likely related to hypertension. There is no significant stenosis. The A1 segments and left M1 segments are normal. Signal loss of the right M1 segment is felt to be artifactual. The MCA bifurcations are intact. There is asymmetric attenuation of left MCA branch vessels, exaggerated by artifact. Moderate right A2 segment stenosis is present. The right vertebral artery is dominant. The right PICA is visualized and normal. The left PICA is not visualized. The basilar artery is normal. There is significant signal loss in the proximal posterior cerebral arteries bilaterally. IMPRESSION: 1. Acute/subacute nonhemorrhagic white matter infarcts involving the left parietal lobe. 2. Advanced atrophy. 3. Multiple remote lacunar infarcts involving the basal ganglia, brainstem, and cerebellum. 4. No significant proximal stenosis, aneurysm, or occlusion within the anterior circulation. 5. Moderate diffuse small vessel disease is exaggerated by patient motion. 6. Question proximal PCA stenoses bilaterally. These results will be called to the ordering clinician or representative by  the Radiologist Assistant,  and communication documented in the PACS or zVision Dashboard. Electronically Signed   By: Marin Robertshristopher  Mattern M.D.   On: 10/22/2017 11:09   Mr Brain Wo Contrast  Result Date: 10/22/2017 CLINICAL DATA:  Ataxia. Stroke suspected. Weakness. Multiple recent falls. EXAM: MRI HEAD WITHOUT CONTRAST MRA HEAD WITHOUT CONTRAST TECHNIQUE: Multiplanar, multiecho pulse sequences of the brain and surrounding structures were obtained without intravenous contrast. Angiographic images of the head were obtained using MRA technique without contrast. COMPARISON:  CT head without contrast 10/21/2016 FINDINGS: MRI HEAD FINDINGS Brain: The diffusion-weighted images demonstrate acute/subacute non hemorrhagic infarcts in the left parietal white matter adjacent to the atrium of the left lateral ventricle. There is also involvement of the posterior insular cortex. Associated T2 signal changes are consistent with a subacute time frame. Advanced atrophy is present. Multiple remote lacunar infarcts are present in the basal ganglia bilaterally. Relatively minimal white matter change is present otherwise. Remote lacunar infarcts are present within the pons. Remote lacunar infarcts are present in the left greater than right cerebellum. Vascular: Flow is present in the major intracranial arteries. Skull and upper cervical spine: The skull base is within normal limits. The craniocervical junction is normal. A benign appearing pineal cyst is stable, measuring up to 12 mm. No significant solid tissue components are present. Marrow signal is normal. The upper cervical spine is unremarkable. Sinuses/Orbits: The paranasal sinuses and mastoid air cells are clear. Globes and orbits are within normal limits. MRA HEAD FINDINGS Time-of-flight images are somewhat degraded by patient motion. The internal carotid arteries are within normal limits from the high cervical segments through the ICA termini. Tortuosity of the cervical  left ICA is likely related to hypertension. There is no significant stenosis. The A1 segments and left M1 segments are normal. Signal loss of the right M1 segment is felt to be artifactual. The MCA bifurcations are intact. There is asymmetric attenuation of left MCA branch vessels, exaggerated by artifact. Moderate right A2 segment stenosis is present. The right vertebral artery is dominant. The right PICA is visualized and normal. The left PICA is not visualized. The basilar artery is normal. There is significant signal loss in the proximal posterior cerebral arteries bilaterally. IMPRESSION: 1. Acute/subacute nonhemorrhagic white matter infarcts involving the left parietal lobe. 2. Advanced atrophy. 3. Multiple remote lacunar infarcts involving the basal ganglia, brainstem, and cerebellum. 4. No significant proximal stenosis, aneurysm, or occlusion within the anterior circulation. 5. Moderate diffuse small vessel disease is exaggerated by patient motion. 6. Question proximal PCA stenoses bilaterally. These results will be called to the ordering clinician or representative by the Radiologist Assistant, and communication documented in the PACS or zVision Dashboard. Electronically Signed   By: Marin Robertshristopher  Mattern M.D.   On: 10/22/2017 11:09   [place] Carotid Bilateral  Result Date: 10/22/2017 CLINICAL DATA:  Near syncopal episode. History of CAD (post CABG) and hyperlipidemia. EXAM: BILATERAL CAROTID DUPLEX ULTRASOUND TECHNIQUE: Wallace CullensGray scale imaging, color Doppler and duplex ultrasound were performed of bilateral carotid and vertebral arteries in the neck. COMPARISON:  None. FINDINGS: Criteria: Quantification of carotid stenosis is based on velocity parameters that correlate the residual internal carotid diameter with NASCET-based stenosis levels, using the diameter of the distal internal carotid lumen as the denominator for stenosis measurement. The following velocity measurements were obtained: RIGHT ICA:  94/26  cm/sec CCA:  94/18 cm/sec SYSTOLIC ICA/CCA RATIO:  1.0 DIASTOLIC ICA/CCA RATIO:  1.5 ECA:  85 cm/sec LEFT ICA:  70/20 cm/sec CCA:  75/10 cm/sec SYSTOLIC ICA/CCA RATIO:  0.9 DIASTOLIC ICA/CCA RATIO:  1.9 ECA:  72 cm/sec RIGHT CAROTID ARTERY: There is a minimal amount of echogenic plaque within the right carotid bulb (image 16). There is a minimal to moderate amount of echogenic plaque involving the origin and proximal aspects of the right internal carotid artery (image 24), not resulting in elevated peak systolic velocities within the interrogated course the right internal carotid artery to suggest a hemodynamically significant stenosis. RIGHT VERTEBRAL ARTERY:  Antegrade flow LEFT CAROTID ARTERY: There is a moderate amount of mixed echogenic plaque within the left carotid bulb (images 49 and 50), not resulting in elevated peak systolic velocities within the interrogated course the left internal carotid artery to suggest a hemodynamically significant stenosis. LEFT VERTEBRAL ARTERY:  Antegrade flow IMPRESSION: Minimal to moderate amount of bilateral atherosclerotic plaque, not resulting in a hemodynamically significant stenosis within either internal carotid artery. Electronically Signed   By: Simonne Come M.D.   On: 10/22/2017 11:23    Time Spent in minutes  25   Ernst Cumpston M.D on 10/23/2017 at 5:24 PM  Between 7am to 7pm - Pager - [phone]  After 7pm go to [email or web address] - password [facility]  [facility] -  Office  [phone]

## 2017-10-23 NOTE — Care Management Note (Signed)
Case Management Note  Patient Details  Name: ABDIFATAH COLQUHOUN MRN: [id number] Date of Birth: 1930-10-10  Subjective/Objective:  Adm with near synopce, +MRI - Acute/subacute nonhemorrhagic white matter infarcts involving the left parietal lobe . From home with wife, reports falls past few day. Had been walking previously with a RW, but lately has needed help and only doing transfers. Called wife, no answer. Later CSW met with wife and discussed options. CSW working on possible ALF placement.            Action/Plan:CM following.    Expected Discharge Date:    10/24/2017              Expected Discharge Plan:  Assisted Living / Rest Home  In-House Referral:  Clinical Social Work  Discharge planning Services  CM Consult  Post Acute Care Choice:    Choice offered to:  Patient  DME Arranged:    DME Agency:     HH Arranged:    [place] Agency:     Status of Service:  In process, will continue to follow  If discussed at Long Length of Stay Meetings, dates discussed:    Additional Comments:  Nitisha Civello, Chauncey Reading, RN 10/23/2017, 8:11 AM

## 2017-10-23 NOTE — Progress Notes (Signed)
*  PRELIMINARY RESULTS* Echocardiogram 2D Echocardiogram has been performed.  Jeryl Columbialliott, Ranika Mcniel 10/23/2017, 11:08 AM

## 2017-10-23 NOTE — Consult Note (Signed)
[place] A. Joseph Laughter, MD     [email or web address]          Joseph Pena is an 84 y.o. male.   ASSESSMENT/PLAN: 1.   Embolic stroke likely due to atrial fibrillation on therapeutic doses of a warfarin.  I recommended patient be switched to Eliquis.  Aspirin can be discontinued.  2.   Acute gait impairment due to the acute left hemispheric stroke.  The patient does not have a undergoing history of gait impairment/ falling and therefore this does not record but has not a contraindication to long-term anticoagulation.  3.   Baseline history of cognitive impairment /dementia:    The patient is an 84 year old male who presents wth a two-day history of repeated falls. She apparently said above 4 falls in last 2 days. She does have a baseline history of advanced Alzheimer's dementia. She has atrial fibrillation and is on chronic warfarin therapy. Her are now is 2.9 and therefore therapeutic on presentation. Imaging initially showed no significant changes but MRI confirms multiple small embolic-like strokes involving the left hemisphere. He reports that at baseline he ambulates well and has not had issues falling.  He does complain of having low back pain.  He reports that he still unsteady on standing.  He does not clearly report focal numbness or weakness.  He denies headache, her shortness of breath or chest pain.  He does not specifically suggest having slurring of his speech but he clearly has some slurring on examination.  The review of systems otherwise negative.    GENERAL:   This is a very pleasant average weight male in no acute distress.  HEENT:   Normal  ABDOMEN: soft  EXTREMITIES: No edema   BACK: normal  SKIN: Normal by inspection.    MENTAL STATUS: Alert and oriented -  Including his age and month. Speech is mildly dysarthric - 1,  He does have a  Mild/moderate language impairment with some difficulty mostly with comprehension -1.  This results in some  difficulty following commands.  He does follow commands with repeat prompting however.  CRANIAL NERVES: Pupils are equal, round and reactive to light and accomodation; extra ocular movements are full, there is no significant nystagmus; visual fields are full; upper and lower facial muscles are normal in strength and symmetric, there is no flattening of the nasolabial folds; tongue is midline; uvula is midline; shoulder elevation is normal.  MOTOR:   There is a mild drift of the right upper extremity and right leg -1,1.  No drift on the left side.  Strength is 4+ throughout.  COORDINATION: Left finger to nose is normal, right finger to nose is normal, No rest tremor; no intention tremor; no postural tremor; no bradykinesia.  REFLEXES: Deep tendon reflexes are symmetrical and normal.   SENSATION: Normal to light touch, temperature, and pain.   There is no extinction on double simultaneous stimulation.      His NIH stroke scale one, one, one, one the total equal four.    Blood pressure (!) 157/87, pulse 92, temperature (!) 97.5 F (36.4 C), temperature source Oral, resp. rate 18, height '5\' 9"'  (1.753 m), weight 184 lb 1.4 oz (83.5 kg), SpO2 100 %.  Past Medical History:  Diagnosis Date  . Alzheimer disease   . Atrial fibrillation ([place])   . CAD (coronary artery disease)   . H/O echocardiogram 2013   indications: murmur, EF =50-55%  . H/O echocardiogram 2011   indications: Afib, EF 45-50%  .  H/O echocardiogram 2008   atrial fib, EF =>55%  . H/O myocardial perfusion scan 2013   Indications: RBBB, NSSTT  . Mitral regurgitation     Past Surgical History:  Procedure Laterality Date  . APPENDECTOMY    . CARDIAC CATHETERIZATION  2000   70fench JR4  . CORONARY ARTERY BYPASS GRAFT     LIMA to LAD, RIMA to RCA 2000  . SURGERY SCROTAL / TESTICULAR    . TOTAL KNEE ARTHROPLASTY     bilateral    Family History  Problem Relation Age of Onset  . Heart disease Father   . Hypertension  Father   . Diabetes Father   . Arthritis Father   . Heart attack Mother     Social History:  reports that  has never smoked. He has quit using smokeless tobacco. He reports that he does not drink alcohol or use drugs.  Allergies: No Known Allergies  Medications: Prior to Admission medications   Medication Sig Start Date End Date Taking? Authorizing Provider  aspirin 81 MG tablet Take 81 mg by mouth daily.   Yes [provider]  atorvastatin (LIPITOR) 20 MG tablet Take 20 mg by mouth daily.   Yes [provider]  donepezil (ARICEPT) 10 MG tablet Take 10 mg by mouth at bedtime.   Yes [provider]  escitalopram (LEXAPRO) 10 MG tablet Take 5 mg by mouth daily.  12/23/13  Yes [provider]  metoprolol succinate (TOPROL-XL) 25 MG 24 hr tablet TAKE 1 TABLET DAILY Patient taking differently: Take 25 mg by mouth daily.  02/18/14  Yes HMinus Breeding MD  Multiple Vitamin (MULTIVITAMIN) capsule Take 1 capsule by mouth at bedtime.    Yes [provider]  warfarin (COUMADIN) 5 MG tablet Take 1 and 1/2 tablets once daily or as directed Patient taking differently: Take 7.5 mg by mouth every morning.  02/24/13  Yes KTroy Sine MD    Scheduled Meds: . aspirin EC  81 mg Oral Daily  . atorvastatin  20 mg Oral Daily  . donepezil  10 mg Oral QHS  . escitalopram  5 mg Oral Daily  . metoprolol succinate  25 mg Oral Daily  . multivitamin with minerals  1 tablet Oral QHS  . warfarin  5 mg Oral Once  . Warfarin - Pharmacist Dosing Inpatient   Does not apply q1800   Continuous Infusions: . sodium chloride 75 mL/hr at 10/23/17 0511   PRN Meds:.     Results for orders placed or performed during the hospital encounter of 10/21/17 (from the past 48 hour(s))  Comprehensive metabolic panel     Status: Abnormal   Collection Time: 10/21/17  5:03 PM  Result Value Ref Range   Sodium 141 135 - 145 mmol/L   Potassium 4.4 3.5 - 5.1 mmol/L   Chloride 106 101  - 111 mmol/L   CO2 26 22 - 32 mmol/L   Glucose, Bld 161 (H) 65 - 99 mg/dL   BUN 28 (H) 6 - 20 mg/dL   Creatinine, Ser 1.47 (H) 0.61 - 1.24 mg/dL   Calcium 9.3 8.9 - 10.3 mg/dL   Total Protein 6.7 6.5 - 8.1 g/dL   Albumin 3.4 (L) 3.5 - 5.0 g/dL   AST 23 15 - 41 U/L   ALT 18 17 - 63 U/L   Alkaline Phosphatase 55 38 - 126 U/L   Total Bilirubin 0.7 0.3 - 1.2 mg/dL   GFR calc non Af Amer 42 (L) >60  mL/min   GFR calc Af Amer 49 (L) >60 mL/min    Comment: (NOTE) The eGFR has been calculated using the CKD EPI equation. This calculation has not been validated in all clinical situations. eGFR's persistently <60 mL/min signify possible Chronic Kidney Disease.    Anion gap 9 5 - 15  CBC     Status: Abnormal   Collection Time: 10/21/17  5:03 PM  Result Value Ref Range   WBC 9.6 4.0 - 10.5 K/uL   RBC 4.01 (L) 4.22 - 5.81 MIL/uL   Hemoglobin 13.0 13.0 - 17.0 g/dL   HCT 39.3 39.0 - 52.0 %   MCV 98.0 78.0 - 100.0 fL   MCH 32.4 26.0 - 34.0 pg   MCHC 33.1 30.0 - 36.0 g/dL   RDW 12.8 11.5 - 15.5 %   Platelets 144 (L) 150 - 400 K/uL  Protime-INR     Status: Abnormal   Collection Time: 10/21/17  5:03 PM  Result Value Ref Range   Prothrombin Time 30.6 (H) 11.4 - 15.2 seconds   INR 2.97   Magnesium     Status: None   Collection Time: 10/21/17  5:03 PM  Result Value Ref Range   Magnesium 1.9 1.7 - 2.4 mg/dL  Urinalysis, Routine w reflex microscopic     Status: Abnormal   Collection Time: 10/21/17  8:54 PM  Result Value Ref Range   Color, Urine YELLOW YELLOW   APPearance HAZY (A) CLEAR   Specific Gravity, Urine 1.015 1.005 - 1.030   pH 5.0 5.0 - 8.0   Glucose, UA NEGATIVE NEGATIVE mg/dL   Hgb urine dipstick NEGATIVE NEGATIVE   Bilirubin Urine NEGATIVE NEGATIVE   Ketones, ur 5 (A) NEGATIVE mg/dL   Protein, ur 30 (A) NEGATIVE mg/dL   Nitrite NEGATIVE NEGATIVE   Leukocytes, UA NEGATIVE NEGATIVE   RBC / HPF 0-5 0 - 5 RBC/hpf   WBC, UA 0-5 0 - 5 WBC/hpf   Bacteria, UA NONE SEEN NONE SEEN     Squamous Epithelial / LPF 0-5 (A) NONE SEEN   Mucus PRESENT   Protime-INR     Status: Abnormal   Collection Time: 10/22/17  6:05 AM  Result Value Ref Range   Prothrombin Time 31.3 (H) 11.4 - 15.2 seconds   INR 2.12   Basic metabolic panel     Status: Abnormal   Collection Time: 10/22/17  6:05 AM  Result Value Ref Range   Sodium 140 135 - 145 mmol/L   Potassium 4.3 3.5 - 5.1 mmol/L   Chloride 105 101 - 111 mmol/L   CO2 26 22 - 32 mmol/L   Glucose, Bld 96 65 - 99 mg/dL   BUN 21 (H) 6 - 20 mg/dL   Creatinine, Ser 1.33 (H) 0.61 - 1.24 mg/dL   Calcium 8.8 (L) 8.9 - 10.3 mg/dL   GFR calc non Af Amer 47 (L) >60 mL/min   GFR calc Af Amer 55 (L) >60 mL/min    Comment: (NOTE) The eGFR has been calculated using the CKD EPI equation. This calculation has not been validated in all clinical situations. eGFR's persistently <60 mL/min signify possible Chronic Kidney Disease.    Anion gap 9 5 - 15  Protime-INR     Status: Abnormal   Collection Time: 10/23/17  6:02 AM  Result Value Ref Range   Prothrombin Time 30.7 (H) 11.4 - 15.2 seconds   INR 2.97   Lipid panel     Status: Abnormal   Collection Time: 10/23/17  6:02 AM  Result Value Ref Range   Cholesterol 137 0 - 200 mg/dL   Triglycerides 119 <150 mg/dL   HDL 33 (L) >40 mg/dL   Total CHOL/HDL Ratio 4.2 RATIO   VLDL 24 0 - 40 mg/dL   LDL Cholesterol 80 0 - 99 mg/dL    Comment:        Total Cholesterol/HDL:CHD Risk Coronary Heart Disease Risk Table                     Men   Women  1/2 Average Risk   3.4   3.3  Average Risk       5.0   4.4  2 X Average Risk   9.6   7.1  3 X Average Risk  23.4   11.0        Use the calculated Patient Ratio above and the CHD Risk Table to determine the patient's CHD Risk.        ATP III CLASSIFICATION (LDL):  <100     mg/dL   Optimal  100-129  mg/dL   Near or Above                    Optimal  130-159  mg/dL   Borderline  160-189  mg/dL   High  >190     mg/dL   Very High      Studies/Results:  BRAIN MRI MRA FINDINGS: MRI HEAD FINDINGS  Brain: The diffusion-weighted images demonstrate acute/subacute non hemorrhagic infarcts in the left parietal white matter adjacent to the atrium of the left lateral ventricle. There is also involvement of the posterior insular cortex. Associated T2 signal changes are consistent with a subacute time frame.  Advanced atrophy is present. Multiple remote lacunar infarcts are present in the basal ganglia bilaterally. Relatively minimal white matter change is present otherwise. Remote lacunar infarcts are present within the pons. Remote lacunar infarcts are present in the left greater than right cerebellum.  Vascular: Flow is present in the major intracranial arteries.  Skull and upper cervical spine: The skull base is within normal limits. The craniocervical junction is normal. A benign appearing pineal cyst is stable, measuring up to 12 mm. No significant solid tissue components are present. Marrow signal is normal. The upper cervical spine is unremarkable.  Sinuses/Orbits: The paranasal sinuses and mastoid air cells are clear. Globes and orbits are within normal limits.  MRA HEAD FINDINGS  Time-of-flight images are somewhat degraded by patient motion. The internal carotid arteries are within normal limits from the high cervical segments through the ICA termini. Tortuosity of the cervical left ICA is likely related to hypertension. There is no significant stenosis. The A1 segments and left M1 segments are normal. Signal loss of the right M1 segment is felt to be artifactual. The MCA bifurcations are intact. There is asymmetric attenuation of left MCA branch vessels, exaggerated by artifact. Moderate right A2 segment stenosis is present.  The right vertebral artery is dominant. The right PICA is visualized and normal. The left PICA is not visualized. The basilar artery is normal. There is significant  signal loss in the proximal posterior cerebral arteries bilaterally.  IMPRESSION: 1. Acute/subacute nonhemorrhagic white matter infarcts involving the left parietal lobe. 2. Advanced atrophy. 3. Multiple remote lacunar infarcts involving the basal ganglia, brainstem, and cerebellum. 4. No significant proximal stenosis, aneurysm, or occlusion within the anterior circulation. 5. Moderate diffuse small vessel disease is exaggerated by patient motion. 6. Question proximal PCA  stenoses bilaterally.     Carotid duplex Doppler is normal.       The brain MRI and MRA are reviewed in person. There are multiple deep white matter infarcts in an apparent embolic shower involving the left posterior frontal and parietal regions. There is also a single cortical small lesion on involving the insular cortex that is more subacute. All these are seen on DWI as bright signal. They're consistent with acute to subacute embolic phenomena. There is marked global atrophy. There is some mild confluent deep white matter inignal seen on FLAIR. No hemorrhages appreciated. There are bilateral lacunar infarcts seen involving the basal ganglia. MRA shows moderate stenosis involving the M1 segment on the right side. There is also evidence of stenosis involving the PCA bilaterally but this area is prone to artifact.  Ferry Matthis A. Joseph Pena, M.D.  Diplomate, [employer] of [employer]iatry and Neurology ( Neurology). 10/23/2017, 9:13 AM

## 2017-10-24 DIAGNOSIS — A09 Infectious gastroenteritis and colitis, unspecified: Secondary | ICD-10-CM

## 2017-10-24 LAB — PROTIME-INR
INR: 2.2
PROTHROMBIN TIME: 24.2 s — AB (ref 11.4–15.2)

## 2017-10-24 LAB — C DIFFICILE QUICK SCREEN W PCR REFLEX
C DIFFICILE (CDIFF) TOXIN: NEGATIVE
C DIFFICLE (CDIFF) ANTIGEN: NEGATIVE
C Diff interpretation: NOT DETECTED

## 2017-10-24 MED ORDER — MAGNESIUM SULFATE 2 GM/50ML IV SOLN
2.0000 g | Freq: Once | INTRAVENOUS | Status: AC
  Administered 2017-10-24: 2 g via INTRAVENOUS
  Filled 2017-10-24: qty 50

## 2017-10-24 MED ORDER — METOPROLOL SUCCINATE ER 50 MG PO TB24
50.0000 mg | ORAL_TABLET | Freq: Every day | ORAL | Status: DC
  Administered 2017-10-24: 25 mg via ORAL
  Administered 2017-10-25 – 2017-10-26 (×2): 50 mg via ORAL
  Filled 2017-10-24 (×2): qty 1

## 2017-10-24 MED ORDER — SODIUM CHLORIDE 0.9 % IV SOLN
INTRAVENOUS | Status: AC
  Administered 2017-10-24 (×2): via INTRAVENOUS

## 2017-10-24 MED ORDER — METOPROLOL TARTRATE 5 MG/5ML IV SOLN
5.0000 mg | Freq: Once | INTRAVENOUS | Status: AC
  Administered 2017-10-24: 5 mg via INTRAVENOUS
  Filled 2017-10-24: qty 5

## 2017-10-24 MED ORDER — ACETAMINOPHEN 325 MG PO TABS
650.0000 mg | ORAL_TABLET | Freq: Four times a day (QID) | ORAL | Status: DC | PRN
  Administered 2017-10-24: 650 mg via ORAL
  Filled 2017-10-24: qty 2

## 2017-10-24 MED ORDER — LOPERAMIDE HCL 1 MG/5ML PO LIQD
2.0000 mg | ORAL | Status: DC | PRN
  Filled 2017-10-24: qty 10

## 2017-10-24 NOTE — Progress Notes (Signed)
PROGRESS NOTE                                                                                                                                                                                                             Patient Demographics:    Joseph Pena, is a 83 y.o. male, DOB - Aug 11, 1931, ZOX:[id number]  Admit date - 10/21/2017   Admitting Physician Bobette Mo, MD  Outpatient Primary MD for the patient is Joseph Jester, DO  LOS - 1  Outpatient Specialists: None  Chief Complaint  Patient presents with  . Fall       Brief Narrative   84 year old male with history of Alzheimer's dementia, A. fib on Coumadin, coronary artery disease, mitral regurgitation and hyperlipidemia brought from home by his wife with frequent falls for the past 2-3 days. Patient had almost 4 episodes a fall in the past 2 days and normally uses a walker. Patient lives with his wife who is his primary caregiver and has difficulty helping him with transfers. In the ED vitals were stable. Initial blood work showed INR of 2.97, mild AKI with BUN of 20 and creatinine 1.47. CT head showed chronic small vessel ischemic changes and old lacunar infarct in the left pons and bilateral basal ganglia without acute stroke. Patient given IV fluid bolus and placed on observation.  MRI brain/MRA head done showing acute-subacute nonhemorrhagic infarct over left parietal lobe.   Subjective:   Had low-grade fever of 100.3 F with several episodes of foul-smelling diarrhea.  Also was tachycardic to 120s on the monitor this morning.  Remains confused.   Assessment  & Plan :    Principal Problem: Acute ischemic stroke [facility]) Has acute/subacute stroke of the left parietal lobe which is likely contributed to his frequent falls recently. Carotid US without significant disease. Echo with normal EF, LVH and severe LA dilatation. Continue aspirin, increase  Lipitor dose to 40 mg daily. LDL of 80. Patient on Coumadin for A. fib.  Neurology consult appreciated, recommends switching aspirin and coumadin to eliquis.  (Will be started tomorrow once INR better)  Active Problems:   Atrial fibrillation (CHADS2vasc of 6 ) (HCC) Heart rate elevated.  Increased metoprolol dose.  Was on ASA and coumadin and will start on eliquis from tomorrow.  Diarrhea with low-grade fever Stool for C. difficile sent.  Continue gentle hydration.  Acute kidney injury Mild. Improved with gentle hydration.    Hyperlipidemia LDL of 80, increased lipitor to 40 mg daily    Alzheimer disease with dementia Continue aricept       Code Status : Full code   Family Communication  : unable to reach wife on the phone  Disposition Plan  : PT recommends SNF.  Possibly discharge on 2/1.  Barriers For Discharge : pending clinical improvement  Consults  : Neurology   Procedures  : Head CT MRI brain Carotid ultrasound 2-D echo    DVT Prophylaxis  :  Coumadin  Lab Results  Component Value Date   PLT 144 (L) 10/21/2017    Antibiotics  :    Anti-infectives (From admission, onward)   None        Objective:   Vitals:   10/24/17 0115 10/24/17 0300 10/24/17 0700 10/24/17 0740  BP: (!) 138/92 (!) 169/81 (!) 158/72   Pulse:  61 87   Resp:  18 20   Temp:  100.3 F (37.9 C) 98 F (36.7 C)   TempSrc:  Oral Oral   SpO2:  97% 100% 98%  Weight:      Height:        Wt Readings from Last 3 Encounters:  10/22/17 83.5 kg (184 lb 1.4 oz)  11/24/16 82.1 kg (181 lb)  04/04/16 80.9 kg (178 lb 4.8 oz)     Intake/Output Summary (Last 24 hours) at 10/24/2017 1117 Last data filed at 10/24/2017 0626 Gross per 24 hour  Intake 770 ml  Output 1850 ml  Net -1080 ml     Physical Exam General: Elderly male, appears fatigued, not in distress, confused HEENT: Moist mucosa, supple neck Chest: Clear bilaterally CVS: S1 and S2 irregular, 3/6 systolic murmur GI:  Soft, nondistended, nontender Musculoskeletal: Warm, no edema CNS: Confused, nonfocal      Data Review:    CBC Recent Labs  Lab 10/21/17 1703  WBC 9.6  HGB 13.0  HCT 39.3  PLT 144*  MCV 98.0  MCH 32.4  MCHC 33.1  RDW 12.8    Chemistries  Recent Labs  Lab 10/21/17 1703 10/22/17 0605  NA 141 140  K 4.4 4.3  CL 106 105  CO2 26 26  GLUCOSE 161* 96  BUN 28* 21*  CREATININE 1.47* 1.33*  CALCIUM 9.3 8.8*  MG 1.9  --   AST 23  --   ALT 18  --   ALKPHOS 55  --   BILITOT 0.7  --    ------------------------------------------------------------------------------------------------------------------ Recent Labs    10/23/17 0602  CHOL 137  HDL 33*  LDLCALC 80  TRIG 409  CHOLHDL 4.2    Lab Results  Component Value Date   HGBA1C 5.5 10/23/2017   ------------------------------------------------------------------------------------------------------------------ No results for input(s): TSH, T4TOTAL, T3FREE, THYROIDAB in the last 72 hours.  Invalid input(s): FREET3 ------------------------------------------------------------------------------------------------------------------ No results for input(s): VITAMINB12, FOLATE, FERRITIN, TIBC, IRON, RETICCTPCT in the last 72 hours.  Coagulation profile Recent Labs  Lab 10/21/17 1703 10/22/17 0605 10/23/17 0602 10/24/17 0608  INR 2.97 3.05 2.97 2.20    No results for input(s): DDIMER in the last 72 hours.  Cardiac Enzymes No results for input(s): CKMB, TROPONINI, MYOGLOBIN in the last 168 hours.  Invalid input(s): CK ------------------------------------------------------------------------------------------------------------------ No results found for: BNP  Inpatient Medications  Scheduled Meds: . atorvastatin  40 mg Oral Daily  . donepezil  10 mg Oral QHS  . escitalopram  5 mg Oral Daily  .  metoprolol succinate  50 mg Oral Daily  . multivitamin with minerals  1 tablet Oral QHS   Continuous Infusions: .  sodium chloride 75 mL/hr at 10/24/17 1021   PRN Meds:.  Micro Results Recent Results (from the past 240 hour(s))  C difficile quick scan w PCR reflex     Status: None   Collection Time: 10/24/17  6:40 AM  Result Value Ref Range Status   C Diff antigen NEGATIVE NEGATIVE Final   C Diff toxin NEGATIVE NEGATIVE Final   C Diff interpretation No C. difficile detected.  Final    Radiology Reports Ct Head Wo Contrast  Result Date: 10/21/2017 CLINICAL DATA:  Frequent falls at home, minor head trauma, history Alzheimer's, atrial fibrillation, coronary artery disease, hypertension EXAM: CT HEAD WITHOUT CONTRAST TECHNIQUE: Contiguous axial images were obtained from the base of the skull through the vertex without intravenous contrast. Sagittal and coronal MPR images reconstructed from axial data set. COMPARISON:  04/20/2017 FINDINGS: Brain: Generalized atrophy. Normal ventricular morphology. No midline shift or mass effect. Small vessel chronic ischemic changes of deep cerebral white matter. Old lacunar infarcts in BILATERAL basal ganglia and LEFT pons. No intracranial hemorrhage, mass lesion or evidence of acute infarction. No extra-axial fluid collections. Vascular: Atherosclerotic calcifications of internal carotid and vertebral arteries at skull base Skull: Intact Sinuses/Orbits: Clear Other: N/A IMPRESSION: Atrophy with small vessel chronic ischemic changes of deep cerebral white matter. Old lacunar infarcts in LEFT pons and BILATERAL basal ganglia. No acute intracranial abnormalities. Electronically Signed   By: Ulyses Southward M.D.   On: 10/21/2017 18:03   Mr Maxine Glenn Head Wo Contrast  Result Date: 10/22/2017 CLINICAL DATA:  Ataxia. Stroke suspected. Weakness. Multiple recent falls. EXAM: MRI HEAD WITHOUT CONTRAST MRA HEAD WITHOUT CONTRAST TECHNIQUE: Multiplanar, multiecho pulse sequences of the brain and surrounding structures were obtained without intravenous contrast. Angiographic images of the head were  obtained using MRA technique without contrast. COMPARISON:  CT head without contrast 10/21/2016 FINDINGS: MRI HEAD FINDINGS Brain: The diffusion-weighted images demonstrate acute/subacute non hemorrhagic infarcts in the left parietal white matter adjacent to the atrium of the left lateral ventricle. There is also involvement of the posterior insular cortex. Associated T2 signal changes are consistent with a subacute time frame. Advanced atrophy is present. Multiple remote lacunar infarcts are present in the basal ganglia bilaterally. Relatively minimal white matter change is present otherwise. Remote lacunar infarcts are present within the pons. Remote lacunar infarcts are present in the left greater than right cerebellum. Vascular: Flow is present in the major intracranial arteries. Skull and upper cervical spine: The skull base is within normal limits. The craniocervical junction is normal. A benign appearing pineal cyst is stable, measuring up to 12 mm. No significant solid tissue components are present. Marrow signal is normal. The upper cervical spine is unremarkable. Sinuses/Orbits: The paranasal sinuses and mastoid air cells are clear. Globes and orbits are within normal limits. MRA HEAD FINDINGS Time-of-flight images are somewhat degraded by patient motion. The internal carotid arteries are within normal limits from the high cervical segments through the ICA termini. Tortuosity of the cervical left ICA is likely related to hypertension. There is no significant stenosis. The A1 segments and left M1 segments are normal. Signal loss of the right M1 segment is felt to be artifactual. The MCA bifurcations are intact. There is asymmetric attenuation of left MCA branch vessels, exaggerated by artifact. Moderate right A2 segment stenosis is present. The right vertebral artery is dominant. The right PICA is  visualized and normal. The left PICA is not visualized. The basilar artery is normal. There is significant  signal loss in the proximal posterior cerebral arteries bilaterally. IMPRESSION: 1. Acute/subacute nonhemorrhagic white matter infarcts involving the left parietal lobe. 2. Advanced atrophy. 3. Multiple remote lacunar infarcts involving the basal ganglia, brainstem, and cerebellum. 4. No significant proximal stenosis, aneurysm, or occlusion within the anterior circulation. 5. Moderate diffuse small vessel disease is exaggerated by patient motion. 6. Question proximal PCA stenoses bilaterally. These results will be called to the ordering clinician or representative by the Radiologist Assistant, and communication documented in the PACS or zVision Dashboard. Electronically Signed   By: Marin Robertshristopher  Mattern M.D.   On: 10/22/2017 11:09   Mr Brain Wo Contrast  Result Date: 10/22/2017 CLINICAL DATA:  Ataxia. Stroke suspected. Weakness. Multiple recent falls. EXAM: MRI HEAD WITHOUT CONTRAST MRA HEAD WITHOUT CONTRAST TECHNIQUE: Multiplanar, multiecho pulse sequences of the brain and surrounding structures were obtained without intravenous contrast. Angiographic images of the head were obtained using MRA technique without contrast. COMPARISON:  CT head without contrast 10/21/2016 FINDINGS: MRI HEAD FINDINGS Brain: The diffusion-weighted images demonstrate acute/subacute non hemorrhagic infarcts in the left parietal white matter adjacent to the atrium of the left lateral ventricle. There is also involvement of the posterior insular cortex. Associated T2 signal changes are consistent with a subacute time frame. Advanced atrophy is present. Multiple remote lacunar infarcts are present in the basal ganglia bilaterally. Relatively minimal white matter change is present otherwise. Remote lacunar infarcts are present within the pons. Remote lacunar infarcts are present in the left greater than right cerebellum. Vascular: Flow is present in the major intracranial arteries. Skull and upper cervical spine: The skull base is within  normal limits. The craniocervical junction is normal. A benign appearing pineal cyst is stable, measuring up to 12 mm. No significant solid tissue components are present. Marrow signal is normal. The upper cervical spine is unremarkable. Sinuses/Orbits: The paranasal sinuses and mastoid air cells are clear. Globes and orbits are within normal limits. MRA HEAD FINDINGS Time-of-flight images are somewhat degraded by patient motion. The internal carotid arteries are within normal limits from the high cervical segments through the ICA termini. Tortuosity of the cervical left ICA is likely related to hypertension. There is no significant stenosis. The A1 segments and left M1 segments are normal. Signal loss of the right M1 segment is felt to be artifactual. The MCA bifurcations are intact. There is asymmetric attenuation of left MCA branch vessels, exaggerated by artifact. Moderate right A2 segment stenosis is present. The right vertebral artery is dominant. The right PICA is visualized and normal. The left PICA is not visualized. The basilar artery is normal. There is significant signal loss in the proximal posterior cerebral arteries bilaterally. IMPRESSION: 1. Acute/subacute nonhemorrhagic white matter infarcts involving the left parietal lobe. 2. Advanced atrophy. 3. Multiple remote lacunar infarcts involving the basal ganglia, brainstem, and cerebellum. 4. No significant proximal stenosis, aneurysm, or occlusion within the anterior circulation. 5. Moderate diffuse small vessel disease is exaggerated by patient motion. 6. Question proximal PCA stenoses bilaterally. These results will be called to the ordering clinician or representative by the Radiologist Assistant, and communication documented in the PACS or zVision Dashboard. Electronically Signed   By: Marin Robertshristopher  Mattern M.D.   On: 10/22/2017 11:09   [place] Carotid Bilateral  Result Date: 10/22/2017 CLINICAL DATA:  Near syncopal episode. History of CAD (post  CABG) and hyperlipidemia. EXAM: BILATERAL CAROTID DUPLEX ULTRASOUND TECHNIQUE: Wallace CullensGray scale imaging,  color Doppler and duplex ultrasound were performed of bilateral carotid and vertebral arteries in the neck. COMPARISON:  None. FINDINGS: Criteria: Quantification of carotid stenosis is based on velocity parameters that correlate the residual internal carotid diameter with NASCET-based stenosis levels, using the diameter of the distal internal carotid lumen as the denominator for stenosis measurement. The following velocity measurements were obtained: RIGHT ICA:  94/26 cm/sec CCA:  94/18 cm/sec SYSTOLIC ICA/CCA RATIO:  1.0 DIASTOLIC ICA/CCA RATIO:  1.5 ECA:  85 cm/sec LEFT ICA:  70/20 cm/sec CCA:  75/10 cm/sec SYSTOLIC ICA/CCA RATIO:  0.9 DIASTOLIC ICA/CCA RATIO:  1.9 ECA:  72 cm/sec RIGHT CAROTID ARTERY: There is a minimal amount of echogenic plaque within the right carotid bulb (image 16). There is a minimal to moderate amount of echogenic plaque involving the origin and proximal aspects of the right internal carotid artery (image 24), not resulting in elevated peak systolic velocities within the interrogated course the right internal carotid artery to suggest a hemodynamically significant stenosis. RIGHT VERTEBRAL ARTERY:  Antegrade flow LEFT CAROTID ARTERY: There is a moderate amount of mixed echogenic plaque within the left carotid bulb (images 49 and 50), not resulting in elevated peak systolic velocities within the interrogated course the left internal carotid artery to suggest a hemodynamically significant stenosis. LEFT VERTEBRAL ARTERY:  Antegrade flow IMPRESSION: Minimal to moderate amount of bilateral atherosclerotic plaque, not resulting in a hemodynamically significant stenosis within either internal carotid artery. Electronically Signed   By: Simonne Come M.D.   On: 10/22/2017 11:23    Time Spent in minutes  25   Ayianna Darnold M.D on 10/24/2017 at 11:17 AM  Between 7am to 7pm - Pager -  [phone]  After 7pm go to [email or web address] - password [email or web address]  [facility] -  Office  [phone]

## 2017-10-24 NOTE — Progress Notes (Signed)
Dr. Robb [facility]rtiz [facility] temperature of 100.3 with no Tylenol ordered, BP 169/81 as well as HR running in the 116-120's. Waiting for orders call back.

## 2017-10-24 NOTE — Progress Notes (Signed)
Physical Therapy Treatment Patient Details Name: Joseph Pena MRN: [id number] DOB: 1931/06/14 Today's Date: 10/24/2017    History of Present Illness Joseph Pena is a 84 y.o. male with medical history significant of Alzheimer's disease, atrial fibrillation, CAD, mitral regurgitation, hyperlipidemia who is brought to the emergency department via EMS due to having frequent falls at home for the past 2-3 days.    PT Comments    Pt supine in bed with eyes shut upon therapist entrance.  Pt would respond to questions with eyes shut and inability to complete thought.  Pt unable to remember name, DOB or current location, RN aware of status.  Pt would open eyes upon request, stated he felt very lazy today.  Max A with bed mobiltiy supine to sit and max A with standing, required elevated height and max A to stand safely.  No transfer to chair complete this session due to safety and pt very lethargic.  Left in bed with call bell within reach and bed alarm set.  Follow Up Recommendations        Equipment Recommendations       Recommendations for Other Services       Precautions / Restrictions Precautions Precautions: Fall Restrictions Weight Bearing Restrictions: No    Mobility  Bed Mobility Overal bed mobility: Needs Assistance Bed Mobility: Supine to Sit;Sit to Supine     Supine to sit: Mod assist Sit to supine: Mod assist   General bed mobility comments: limited use of RUE due to weakness, leans to the right, very lethargic  Transfers Overall transfer level: Needs assistance Equipment used: Rolling walker (2 wheeled) Transfers: Sit to/from Stand Sit to Stand: Max assist         General transfer comment: difficulty moving RLE due to weakness, max A sit to stand, very lethargic.  No transfer complete this session for safety.    Ambulation/Gait                 Stairs            Wheelchair Mobility    Modified Rankin (Stroke Patients Only)        Balance                                            Cognition Arousal/Alertness: Lethargic Behavior During Therapy: Flat affect;WFL for tasks assessed/performed Overall Cognitive Status: Impaired/Different from baseline Area of Impairment: Memory                     Memory: Decreased short-term memory         General Comments: Pt unable to stated name, DOB or current location.  Spoke to RN who stated he was able to recall name earlier this morning.  Pt unable to keep eyes upon unless requested.  Stated he is feeling "very lazy today".      Exercises Total Joint Exercises Long Arc Quad: Both;5 reps;Seated(Rt sided lean)    General Comments        Pertinent Vitals/Pain Pain Assessment: No/denies pain    Home Living                      Prior Function            PT Goals (current goals can now be found in the care plan section)  Frequency           PT Plan Current plan remains appropriate    Co-evaluation              AM-PAC PT "6 Clicks" Daily Activity  Outcome Measure  Difficulty turning over in bed (including adjusting bedclothes, sheets and blankets)?: A Little Difficulty moving from lying on back to sitting on the side of the bed? : A Lot Difficulty sitting down on and standing up from a chair with arms (e.g., wheelchair, bedside commode, etc,.)?: A Lot Help needed moving to and from a bed to chair (including a wheelchair)?: A Lot Help needed walking in hospital room?: A Lot Help needed climbing 3-5 steps with a railing? : Total 6 Click Score: 12    End of Session Equipment Utilized During Treatment: Gait belt Activity Tolerance: Patient limited by lethargy Patient left: in bed;with call bell/phone within reach;with bed alarm set Nurse Communication: Mobility status PT Visit Diagnosis: Unsteadiness on feet (R26.81);Other abnormalities of gait and mobility (R26.89);Muscle weakness (generalized) (M62.81)      Time: 1610-9604 PT Time Calculation (min) (ACUTE ONLY): 30 min  Charges:  $Therapeutic Activity: 23-37 mins                    G Codes:       Becky Sax, LPTA; CBIS [phone]  Juel Burrow 10/24/2017, 12:05 PM

## 2017-10-24 NOTE — Progress Notes (Signed)
RN placed pt on enteric precautions d/t 2 type 7 stools  And smell of C-diff. Also, pts temperature increased to 100.3. Dr. Robb Matarrtiz made aware of isolation. Stool sample ordered and sent. Will continue to monitor pt

## 2017-10-24 NOTE — NC FL2 (Deleted)
[place] MEDICAID FL2 LEVEL OF CARE SCREENING TOOL     IDENTIFICATION  Patient Name: Joseph Pena Birthdate: 06/08/1931 Sex: male Admission Date (Current Location): 10/21/2017  [facility] and [place] Number:  [employer] and Address:  [facility],  [address] [place] [address]      Provider Number: [id number]  Attending Physician Name and Address:  Eddie North, MD  Relative Name and Phone Number:       Current Level of Care: Hospital Recommended Level of Care: Assisted Living Facility Prior Approval Number:    Date Approved/Denied:   PASRR Number: [id number])  Discharge Plan: SNF    Current Diagnoses: Patient Active Problem List   Diagnosis Date Noted  . Near syncope 10/21/2017  . Alzheimer disease 10/21/2017  . CKD (chronic kidney disease) stage 3, GFR 30-59 ml/min (HCC) 04/05/2016  . Cellulitis of left upper extremity 04/05/2016  . Elevated troponin 04/05/2016  . SIRS (systemic inflammatory response syndrome) (HCC) 04/04/2016  . CAD (coronary artery disease) 08/15/2013  . HTN (hypertension) 08/15/2013  . Hyperlipidemia 08/15/2013  . Atrial fibrillation (HCC) 12/01/2012  . Long term (current) use of anticoagulants 12/01/2012    Orientation RESPIRATION BLADDER Height & Weight     Self  Normal Continent Weight: 184 lb 1.4 oz (83.5 kg) Height:  5\' 9"  (175.3 cm)  BEHAVIORAL SYMPTOMS/MOOD NEUROLOGICAL BOWEL NUTRITION STATUS      Continent Diet(Heart Healthy)  AMBULATORY STATUS COMMUNICATION OF NEEDS Skin   Extensive Assist Verbally Normal                       Personal Care Assistance Level of Assistance  Bathing, Feeding, Dressing Bathing Assistance: Limited assistance Feeding assistance: Independent Dressing Assistance: Limited assistance     Functional Limitations Info  Sight, Hearing, Speech Sight Info: Adequate Hearing Info: Adequate Speech Info: Adequate    SPECIAL CARE FACTORS FREQUENCY   PT (By licensed PT)     PT Frequency: 3x/week              Contractures Contractures Info: Not present    Additional Factors Info  Code Status, Allergies, Psychotropic Code Status Info: Full Code Allergies Info: NKA Psychotropic Info: Lexapro         Current Medications (10/24/2017):  This is the current hospital active medication list Current Facility-Administered Medications  Medication Dose Route Frequency Provider Last Rate Last Dose  . 0.9 %  sodium chloride infusion   Intravenous Continuous Dhungel, Nishant, MD 75 mL/hr at 10/24/17 1021    . acetaminophen (TYLENOL) tablet 650 mg  650 mg Oral Q6H PRN Bobette Mo, MD   650 mg at 10/24/17 [phone]  . atorvastatin (LIPITOR) tablet 40 mg  40 mg Oral Daily Dhungel, Nishant, MD   40 mg at 10/24/17 0849  . donepezil (ARICEPT) tablet 10 mg  10 mg Oral QHS Bobette Mo, MD   10 mg at 10/23/17 2249  . escitalopram (LEXAPRO) tablet 5 mg  5 mg Oral Daily Bobette Mo, MD   5 mg at 10/24/17 0849  . loperamide (IMODIUM) 1 MG/5ML solution 2 mg  2 mg Oral PRN Dhungel, Nishant, MD      . metoprolol succinate (TOPROL-XL) 24 hr tablet 50 mg  50 mg Oral Daily Dhungel, Nishant, MD   25 mg at 10/24/17 1020  . multivitamin with minerals tablet 1 tablet  1 tablet Oral QHS Bobette Mo, MD   1 tablet at 10/23/17 2249  Discharge Medications: Please see discharge summary for a list of discharge medications.  Relevant Imaging Results:  Relevant Lab Results:   Additional Information SSN [id number]  Geo Slone, Juleen ChinaHeather D, [place]CS[place]

## 2017-10-24 NOTE — Care Management Important Message (Signed)
Important Message  Patient Details  Name: Joseph Pena MRN: [id number] Date of Birth: March 07, 1931   Medicare Important Message Given:  Yes    Maleek Craver, Chrystine OilerSharley Diane, RN 10/24/2017, 9:47 AM

## 2017-10-25 ENCOUNTER — Inpatient Hospital Stay (HOSPITAL_COMMUNITY): Payer: Medicare Other

## 2017-10-25 DIAGNOSIS — F0280 Dementia in other diseases classified elsewhere without behavioral disturbance: Secondary | ICD-10-CM

## 2017-10-25 DIAGNOSIS — G309 Alzheimer's disease, unspecified: Secondary | ICD-10-CM

## 2017-10-25 LAB — BASIC METABOLIC PANEL
ANION GAP: 9 (ref 5–15)
BUN: 26 mg/dL — ABNORMAL HIGH (ref 6–20)
CALCIUM: 8.2 mg/dL — AB (ref 8.9–10.3)
CO2: 22 mmol/L (ref 22–32)
Chloride: 107 mmol/L (ref 101–111)
Creatinine, Ser: 1.36 mg/dL — ABNORMAL HIGH (ref 0.61–1.24)
GFR calc non Af Amer: 46 mL/min — ABNORMAL LOW (ref >60)
GFR, EST AFRICAN AMERICAN: 53 mL/min — AB (ref >60)
GLUCOSE: 92 mg/dL (ref 65–99)
POTASSIUM: 3.6 mmol/L (ref 3.5–5.1)
Sodium: 138 mmol/L (ref 135–145)

## 2017-10-25 LAB — CBC
HEMATOCRIT: 39.1 % (ref 39.0–52.0)
Hemoglobin: 13.1 g/dL (ref 13.0–17.0)
MCH: 32.9 pg (ref 26.0–34.0)
MCHC: 33.5 g/dL (ref 30.0–36.0)
MCV: 98.2 fL (ref 78.0–100.0)
Platelets: 166 10*3/uL (ref 150–400)
RBC: 3.98 MIL/uL — AB (ref 4.22–5.81)
RDW: 12.9 % (ref 11.5–15.5)
WBC: 7.4 10*3/uL (ref 4.0–10.5)

## 2017-10-25 LAB — PROTIME-INR
INR: 2.34
Prothrombin Time: 25.4 seconds — ABNORMAL HIGH (ref 11.4–15.2)

## 2017-10-25 MED ORDER — APIXABAN 5 MG PO TABS
5.0000 mg | ORAL_TABLET | Freq: Two times a day (BID) | ORAL | Status: DC
  Administered 2017-10-26: 5 mg via ORAL
  Filled 2017-10-25: qty 1

## 2017-10-25 NOTE — [place] FL2 (Signed)
[place] MEDICAID FL2 LEVEL OF CARE SCREENING TOOL     IDENTIFICATION  Patient Name: GRIFFIN GERRARD Birthdate: September 21, 1931 Sex: male Admission Date (Current Location): 10/21/2017  [facility] and [place] Number:  [employer] and Address:  [facility],  [address], [place] [address]      Provider Number: [id number]  Attending Physician Name and Address:  Eddie North, MD  Relative Name and Phone Number:       Current Level of Care: Hospital Recommended Level of Care: Skilled Nursing Facility Prior Approval Number:    Date Approved/Denied:   PASRR Number: [id number])  Discharge Plan: SNF    Current Diagnoses: Patient Active Problem List   Diagnosis Date Noted  . Near syncope 10/21/2017  . Alzheimer disease 10/21/2017  . CKD (chronic kidney disease) stage 3, GFR 30-59 ml/min (HCC) 04/05/2016  . Cellulitis of left upper extremity 04/05/2016  . Elevated troponin 04/05/2016  . SIRS (systemic inflammatory response syndrome) (HCC) 04/04/2016  . CAD (coronary artery disease) 08/15/2013  . HTN (hypertension) 08/15/2013  . Hyperlipidemia 08/15/2013  . Atrial fibrillation (HCC) 12/01/2012  . Long term (current) use of anticoagulants 12/01/2012    Orientation RESPIRATION BLADDER Height & Weight     Self  Normal Continent Weight: 184 lb 1.4 oz (83.5 kg) Height:  5\' 9"  (175.3 cm)  BEHAVIORAL SYMPTOMS/MOOD NEUROLOGICAL BOWEL NUTRITION STATUS      Continent Diet(Heart Healthy)  AMBULATORY STATUS COMMUNICATION OF NEEDS Skin   Extensive Assist Verbally Normal                       Personal Care Assistance Level of Assistance  Bathing, Feeding, Dressing Bathing Assistance: Limited assistance Feeding assistance: Independent Dressing Assistance: Limited assistance     Functional Limitations Info  Sight, Hearing, Speech Sight Info: Adequate Hearing Info: Adequate Speech Info: Adequate    SPECIAL CARE FACTORS FREQUENCY   PT (By licensed PT)     PT Frequency: 3x/week              Contractures Contractures Info: Not present    Additional Factors Info  Code Status, Allergies, Psychotropic Code Status Info: Full Code Allergies Info: NKA Psychotropic Info: Lexapro         Current Medications (10/25/2017):  This is the current hospital active medication list Current Facility-Administered Medications  Medication Dose Route Frequency Provider Last Rate Last Dose  . 0.9 %  sodium chloride infusion   Intravenous Continuous Dhungel, Nishant, MD 75 mL/hr at 10/24/17 2222    . acetaminophen (TYLENOL) tablet 650 mg  650 mg Oral Q6H PRN Bobette Mo, MD   650 mg at 10/24/17 [phone]  . atorvastatin (LIPITOR) tablet 40 mg  40 mg Oral Daily Dhungel, Nishant, MD   40 mg at 10/24/17 0849  . donepezil (ARICEPT) tablet 10 mg  10 mg Oral QHS Bobette Mo, MD   10 mg at 10/24/17 2215  . escitalopram (LEXAPRO) tablet 5 mg  5 mg Oral Daily Bobette Mo, MD   5 mg at 10/24/17 0849  . loperamide (IMODIUM) 1 MG/5ML solution 2 mg  2 mg Oral PRN Dhungel, Nishant, MD      . metoprolol succinate (TOPROL-XL) 24 hr tablet 50 mg  50 mg Oral Daily Dhungel, Nishant, MD   25 mg at 10/24/17 1020  . multivitamin with minerals tablet 1 tablet  1 tablet Oral QHS Bobette Mo, MD   1 tablet at 10/24/17 2215  Discharge Medications: Please see discharge summary for a list of discharge medications.  Relevant Imaging Results:  Relevant Lab Results:   Additional Information SSN [id number]  Ryken Paschal, Juleen ChinaHeather D, [place]

## 2017-10-25 NOTE — Progress Notes (Signed)
Patient had a fall this am, no injury noted. Dr Gonzella Lexhungel notified,orders received,and given. Family notified also. Will continue to monitor patient..Marland Kitchen

## 2017-10-25 NOTE — Progress Notes (Signed)
Patient had a 2.1 sec pause, B/P 143/65,HR 86,room air sat 98 percent. No c/o pain or discomfort noted. Dr Gonzella Lexhungel notified. Will continue to monitor patient.

## 2017-10-25 NOTE — Progress Notes (Signed)
PROGRESS NOTE                                                                                                                                                                                                             Patient Demographics:    Joseph Pena, is a 84 y.o. male, DOB - Jul 26, 1931, ZOX:[id number]  Admit date - 10/21/2017   Admitting Physician Bobette Mo, MD  Outpatient Primary MD for the patient is [name], DO  LOS - 2  Outpatient Specialists: None  Chief Complaint  Patient presents with  . Fall       Brief Narrative   84 year old male with history of Alzheimer's dementia, A. fib on Coumadin, coronary artery disease, mitral regurgitation and hyperlipidemia brought from home by his wife with frequent falls for the past 2-3 days. Patient had almost 4 episodes a fall in the past 2 days and normally uses a walker. Patient lives with his wife who is his primary caregiver and has difficulty helping him with transfers. In the ED vitals were stable. Initial blood work showed INR of 2.97, mild AKI with BUN of 20 and creatinine 1.47. CT head showed chronic small vessel ischemic changes and old lacunar infarct in the left pons and bilateral basal ganglia without acute stroke. Patient given IV fluid bolus and placed on observation.  MRI brain/MRA head done showing acute-subacute nonhemorrhagic infarct over left parietal lobe.   Subjective:   Fever, diarrhea resolved. No overnight events.   Assessment  & Plan :    Principal Problem: Acute ischemic stroke [facility]) Has acute/subacute stroke of the left parietal lobe which is likely contributed to his frequent falls recently. Carotid US without significant disease. Echo with normal EF, LVH and severe LA dilatation. Continue aspirin, increase Lipitor dose to 40 mg daily. LDL of 80. Patient on Coumadin for A. fib.  Neurology consult appreciated, recommends  switching aspirin and coumadin to eliquis.  (Will be started tomorrow Upon discharge. INR still 2.2.)  Active Problems:   Atrial fibrillation (CHADS2vasc of 6 ) (HCC) Heart rate Better controlled after metoprolol dose increased. Off aspirin and Coumadin. Will start on eliquis tomorrow.  Diarrhea with low-grade fever Resolved. Received hydration. Stool for C. Difficile negative.  Acute kidney injury Mild. Improved with gentle hydration.    Hyperlipidemia LDL of 80, increased lipitor to  40 mg daily    Alzheimer disease with dementia Continue aricept       Code Status : Full code   Family Communication  : unable to reach wife on the phone  Disposition Plan  : PT recommends SNF.  Discharge tomorrow.  Barriers For Discharge : pending clinical improvement  Consults  : Neurology   Procedures  : Head CT MRI brain Carotid ultrasound 2-D echo    DVT Prophylaxis  :  Coumadin  Lab Results  Component Value Date   PLT 166 10/25/2017    Antibiotics  :    Anti-infectives (From admission, onward)   None        Objective:   Vitals:   10/24/17 1500 10/24/17 1800 10/24/17 2200 10/25/17 0200  BP: 131/61 114/67 (!) 146/67 137/70  Pulse: 82 80 83 86  Resp: 18 20 20 20   Temp:  98.2 F (36.8 C) 98 F (36.7 C) 98 F (36.7 C)  TempSrc:  Oral Oral Oral  SpO2: 96% 97% 97% 98%  Weight:      Height:        Wt Readings from Last 3 Encounters:  10/22/17 83.5 kg (184 lb 1.4 oz)  11/24/16 82.1 kg (181 lb)  04/04/16 80.9 kg (178 lb 4.8 oz)     Intake/Output Summary (Last 24 hours) at 10/25/2017 1020 Last data filed at 10/24/2017 2259 Gross per 24 hour  Intake 708.75 ml  Output 600 ml  Net 108.75 ml     Physical Exam Gen.: Elderly male, confused, not in distress HEENT: Moist mucosa, supple Neck Chest: Clear bilaterally CVS : S1 and S2 irregular, 3/6 systolic murmur GI: Soft, nondistended, Nontender Musculoskeletal: Warm, no edema CNS: Alert and awake, confused,  nonfoc     Data Review:    CBC Recent Labs  Lab 10/21/17 1703 10/25/17 0551  WBC 9.6 7.4  HGB 13.0 13.1  HCT 39.3 39.1  PLT 144* 166  MCV 98.0 98.2  MCH 32.4 32.9  MCHC 33.1 33.5  RDW 12.8 12.9    Chemistries  Recent Labs  Lab 10/21/17 1703 10/22/17 0605 10/25/17 0551  NA 141 140 138  K 4.4 4.3 3.6  CL 106 105 107  CO2 26 26 22   GLUCOSE 161* 96 92  BUN 28* 21* 26*  CREATININE 1.47* 1.33* 1.36*  CALCIUM 9.3 8.8* 8.2*  MG 1.9  --   --   AST 23  --   --   ALT 18  --   --   ALKPHOS 55  --   --   BILITOT 0.7  --   --    ------------------------------------------------------------------------------------------------------------------ Recent Labs    10/23/17 0602  CHOL 137  HDL 33*  LDLCALC 80  TRIG [id number]  CHOLHDL 4.2    Lab Results  Component Value Date   HGBA1C 5.5 10/23/2017   ------------------------------------------------------------------------------------------------------------------ No results for input(s): TSH, T4TOTAL, T3FREE, THYROIDAB in the last 72 hours.  Invalid input(s): FREET3 ------------------------------------------------------------------------------------------------------------------ No results for input(s): VITAMINB12, FOLATE, FERRITIN, TIBC, IRON, RETICCTPCT in the last 72 hours.  Coagulation profile Recent Labs  Lab 10/21/17 1703 10/22/17 0605 10/23/17 0602 10/24/17 0608 10/25/17 0551  INR 2.97 3.05 2.97 2.20 2.34    No results for input(s): DDIMER in the last 72 hours.  Cardiac Enzymes No results for input(s): CKMB, TROPONINI, MYOGLOBIN in the last 168 hours.  Invalid input(s): CK ------------------------------------------------------------------------------------------------------------------ No results found for: BNP  Inpatient Medications  Scheduled Meds: . atorvastatin  40 mg Oral Daily  .  donepezil  10 mg Oral QHS  . escitalopram  5 mg Oral Daily  . metoprolol succinate  50 mg Oral Daily  .  multivitamin with minerals  1 tablet Oral QHS   Continuous Infusions:  PRN Meds:.  Micro Results Recent Results (from the past 240 hour(s))  C difficile quick scan w PCR reflex     Status: None   Collection Time: 10/24/17  6:40 AM  Result Value Ref Range Status   C Diff antigen NEGATIVE NEGATIVE Final   C Diff toxin NEGATIVE NEGATIVE Final   C Diff interpretation No C. difficile detected.  Final    Radiology Reports Ct Head Wo Contrast  Result Date: 10/21/2017 CLINICAL DATA:  Frequent falls at home, minor head trauma, history Alzheimer's, atrial fibrillation, coronary artery disease, hypertension EXAM: CT HEAD WITHOUT CONTRAST TECHNIQUE: Contiguous axial images were obtained from the base of the skull through the vertex without intravenous contrast. Sagittal and coronal MPR images reconstructed from axial data set. COMPARISON:  04/20/2017 FINDINGS: Brain: Generalized atrophy. Normal ventricular morphology. No midline shift or mass effect. Small vessel chronic ischemic changes of deep cerebral white matter. Old lacunar infarcts in BILATERAL basal ganglia and LEFT pons. No intracranial hemorrhage, mass lesion or evidence of acute infarction. No extra-axial fluid collections. Vascular: Atherosclerotic calcifications of internal carotid and vertebral arteries at skull base Skull: Intact Sinuses/Orbits: Clear Other: N/A IMPRESSION: Atrophy with small vessel chronic ischemic changes of deep cerebral white matter. Old lacunar infarcts in LEFT pons and BILATERAL basal ganglia. No acute intracranial abnormalities. Electronically Signed   By: Ulyses Southward M.D.   On: 10/21/2017 18:03   Mr Maxine Glenn Head Wo Contrast  Result Date: 10/22/2017 CLINICAL DATA:  Ataxia. Stroke suspected. Weakness. Multiple recent falls. EXAM: MRI HEAD WITHOUT CONTRAST MRA HEAD WITHOUT CONTRAST TECHNIQUE: Multiplanar, multiecho pulse sequences of the brain and surrounding structures were obtained without intravenous contrast.  Angiographic images of the head were obtained using MRA technique without contrast. COMPARISON:  CT head without contrast 10/21/2016 FINDINGS: MRI HEAD FINDINGS Brain: The diffusion-weighted images demonstrate acute/subacute non hemorrhagic infarcts in the left parietal white matter adjacent to the atrium of the left lateral ventricle. There is also involvement of the posterior insular cortex. Associated T2 signal changes are consistent with a subacute time frame. Advanced atrophy is present. Multiple remote lacunar infarcts are present in the basal ganglia bilaterally. Relatively minimal white matter change is present otherwise. Remote lacunar infarcts are present within the pons. Remote lacunar infarcts are present in the left greater than right cerebellum. Vascular: Flow is present in the major intracranial arteries. Skull and upper cervical spine: The skull base is within normal limits. The craniocervical junction is normal. A benign appearing pineal cyst is stable, measuring up to 12 mm. No significant solid tissue components are present. Marrow signal is normal. The upper cervical spine is unremarkable. Sinuses/Orbits: The paranasal sinuses and mastoid air cells are clear. Globes and orbits are within normal limits. MRA HEAD FINDINGS Time-of-flight images are somewhat degraded by patient motion. The internal carotid arteries are within normal limits from the high cervical segments through the ICA termini. Tortuosity of the cervical left ICA is likely related to hypertension. There is no significant stenosis. The A1 segments and left M1 segments are normal. Signal loss of the right M1 segment is felt to be artifactual. The MCA bifurcations are intact. There is asymmetric attenuation of left MCA branch vessels, exaggerated by artifact. Moderate right A2 segment stenosis is present. The right vertebral  artery is dominant. The right PICA is visualized and normal. The left PICA is not visualized. The basilar artery  is normal. There is significant signal loss in the proximal posterior cerebral arteries bilaterally. IMPRESSION: 1. Acute/subacute nonhemorrhagic white matter infarcts involving the left parietal lobe. 2. Advanced atrophy. 3. Multiple remote lacunar infarcts involving the basal ganglia, brainstem, and cerebellum. 4. No significant proximal stenosis, aneurysm, or occlusion within the anterior circulation. 5. Moderate diffuse small vessel disease is exaggerated by patient motion. 6. Question proximal PCA stenoses bilaterally. These results will be called to the ordering clinician or representative by the Radiologist Assistant, and communication documented in the PACS or zVision Dashboard. Electronically Signed   By: Marin Roberts M.D.   On: 10/22/2017 11:09   Mr Brain Wo Contrast  Result Date: 10/22/2017 CLINICAL DATA:  Ataxia. Stroke suspected. Weakness. Multiple recent falls. EXAM: MRI HEAD WITHOUT CONTRAST MRA HEAD WITHOUT CONTRAST TECHNIQUE: Multiplanar, multiecho pulse sequences of the brain and surrounding structures were obtained without intravenous contrast. Angiographic images of the head were obtained using MRA technique without contrast. COMPARISON:  CT head without contrast 10/21/2016 FINDINGS: MRI HEAD FINDINGS Brain: The diffusion-weighted images demonstrate acute/subacute non hemorrhagic infarcts in the left parietal white matter adjacent to the atrium of the left lateral ventricle. There is also involvement of the posterior insular cortex. Associated T2 signal changes are consistent with a subacute time frame. Advanced atrophy is present. Multiple remote lacunar infarcts are present in the basal ganglia bilaterally. Relatively minimal white matter change is present otherwise. Remote lacunar infarcts are present within the pons. Remote lacunar infarcts are present in the left greater than right cerebellum. Vascular: Flow is present in the major intracranial arteries. Skull and upper cervical  spine: The skull base is within normal limits. The craniocervical junction is normal. A benign appearing pineal cyst is stable, measuring up to 12 mm. No significant solid tissue components are present. Marrow signal is normal. The upper cervical spine is unremarkable. Sinuses/Orbits: The paranasal sinuses and mastoid air cells are clear. Globes and orbits are within normal limits. MRA HEAD FINDINGS Time-of-flight images are somewhat degraded by patient motion. The internal carotid arteries are within normal limits from the high cervical segments through the ICA termini. Tortuosity of the cervical left ICA is likely related to hypertension. There is no significant stenosis. The A1 segments and left M1 segments are normal. Signal loss of the right M1 segment is felt to be artifactual. The MCA bifurcations are intact. There is asymmetric attenuation of left MCA branch vessels, exaggerated by artifact. Moderate right A2 segment stenosis is present. The right vertebral artery is dominant. The right PICA is visualized and normal. The left PICA is not visualized. The basilar artery is normal. There is significant signal loss in the proximal posterior cerebral arteries bilaterally. IMPRESSION: 1. Acute/subacute nonhemorrhagic white matter infarcts involving the left parietal lobe. 2. Advanced atrophy. 3. Multiple remote lacunar infarcts involving the basal ganglia, brainstem, and cerebellum. 4. No significant proximal stenosis, aneurysm, or occlusion within the anterior circulation. 5. Moderate diffuse small vessel disease is exaggerated by patient motion. 6. Question proximal PCA stenoses bilaterally. These results will be called to the ordering clinician or representative by the Radiologist Assistant, and communication documented in the PACS or zVision Dashboard. Electronically Signed   By: Marin Roberts M.D.   On: 10/22/2017 11:09   US Carotid Bilateral  Result Date: 10/22/2017 CLINICAL DATA:  Near syncopal  episode. History of CAD (post CABG) and hyperlipidemia. EXAM: BILATERAL  CAROTID DUPLEX ULTRASOUND TECHNIQUE: Wallace Cullens scale imaging, color Doppler and duplex ultrasound were performed of bilateral carotid and vertebral arteries in the neck. COMPARISON:  None. FINDINGS: Criteria: Quantification of carotid stenosis is based on velocity parameters that correlate the residual internal carotid diameter with NASCET-based stenosis levels, using the diameter of the distal internal carotid lumen as the denominator for stenosis measurement. The following velocity measurements were obtained: RIGHT ICA:  94/26 cm/sec CCA:  94/18 cm/sec SYSTOLIC ICA/CCA RATIO:  1.0 DIASTOLIC ICA/CCA RATIO:  1.5 ECA:  85 cm/sec LEFT ICA:  70/20 cm/sec CCA:  75/10 cm/sec SYSTOLIC ICA/CCA RATIO:  0.9 DIASTOLIC ICA/CCA RATIO:  1.9 ECA:  72 cm/sec RIGHT CAROTID ARTERY: There is a minimal amount of echogenic plaque within the right carotid bulb (image 16). There is a minimal to moderate amount of echogenic plaque involving the origin and proximal aspects of the right internal carotid artery (image 24), not resulting in elevated peak systolic velocities within the interrogated course the right internal carotid artery to suggest a hemodynamically significant stenosis. RIGHT VERTEBRAL ARTERY:  Antegrade flow LEFT CAROTID ARTERY: There is a moderate amount of mixed echogenic plaque within the left carotid bulb (images 49 and 50), not resulting in elevated peak systolic velocities within the interrogated course the left internal carotid artery to suggest a hemodynamically significant stenosis. LEFT VERTEBRAL ARTERY:  Antegrade flow IMPRESSION: Minimal to moderate amount of bilateral atherosclerotic plaque, not resulting in a hemodynamically significant stenosis within either internal carotid artery. Electronically Signed   By: Simonne Come M.D.   On: 10/22/2017 11:23    Time Spent in minutes  25   Jalissa Heinzelman M.D on 10/25/2017 at 10:20 AM  Between  7am to 7pm - Pager - [phone]  After 7pm go to [email or web address] - password [facility]  [facility] -  Office  [phone]

## 2017-10-26 DIAGNOSIS — R296 Repeated falls: Secondary | ICD-10-CM

## 2017-10-26 DIAGNOSIS — I10 Essential (primary) hypertension: Secondary | ICD-10-CM

## 2017-10-26 DIAGNOSIS — G301 Alzheimer's disease with late onset: Secondary | ICD-10-CM

## 2017-10-26 DIAGNOSIS — I63512 Cerebral infarction due to unspecified occlusion or stenosis of left middle cerebral artery: Secondary | ICD-10-CM | POA: Diagnosis present

## 2017-10-26 DIAGNOSIS — R55 Syncope and collapse: Secondary | ICD-10-CM

## 2017-10-26 LAB — PROTIME-INR
INR: 2
PROTHROMBIN TIME: 22.5 s — AB (ref 11.4–15.2)

## 2017-10-26 MED ORDER — ATORVASTATIN CALCIUM 40 MG PO TABS: 40.0000 mg | ORAL_TABLET | Freq: Every day | ORAL | 0 refills | Status: AC

## 2017-10-26 MED ORDER — ACETAMINOPHEN 325 MG PO TABS: 650.0000 mg | ORAL_TABLET | Freq: Four times a day (QID) | ORAL | 0 refills | Status: AC | PRN

## 2017-10-26 MED ORDER — APIXABAN 5 MG PO TABS: 5.0000 mg | ORAL_TABLET | Freq: Two times a day (BID) | ORAL | 0 refills | Status: AC

## 2017-10-26 NOTE — Clinical Social Work Placement (Signed)
   CLINICAL SOCIAL WORK PLACEMENT  NOTE  Date:  10/26/2017  Patient Details  Name: Joseph Pena MRN: [id number] Date of Birth: 1931-04-24  Clinical Social Work is seeking post-discharge placement for this patient at the Skilled  Nursing Facility level of care (*CSW will initial, date and re-position this form in  chart as items are completed):  Yes   Patient/family provided with [place] Clinical Social Work Department's list of facilities offering this level of care within the geographic area requested by the patient (or if unable, by the patient's family).  Yes   Patient/family informed of their freedom to choose among providers that offer the needed level of care, that participate in Medicare, Medicaid or managed care program needed by the patient, have an available bed and are willing to accept the patient.  Yes   Patient/family informed of [facility]'s ownership interest in [facility] and [facility], as well as of the fact that they are under no obligation to receive care at these facilities.  PASRR submitted to EDS on 10/23/17     PASRR number received on 10/23/17     Existing PASRR number confirmed on       FL2 transmitted to all facilities in geographic area requested by pt/family on 10/23/17     FL2 transmitted to all facilities within larger geographic area on       Patient informed that his/her managed care company has contracts with or will negotiate with certain facilities, including the following:        Yes   Patient/family informed of bed offers received.  [facility]     Physician recommends and patient chooses bed at      [facility] 10[facility]26/17.  Patient to be transferred to facility by [facility]     Patient family notified on 10/26/17 of transfer.  Name of family member notified:  Alona BeneJoyce, daughter (spouse with her)     PHYSICIAN      Additional Comment:    Discharge paperwork sent via hub and facility notified.  LCSW signing off.   _______________________________________________ Tretha SciaraSettle, Laniyah Rosenwald D, LCSW 10/26/2017, 11:40 AM

## 2017-10-26 NOTE — Care Management Important Message (Signed)
Important Message  Patient Details  Name: Joseph Pena MRN: [id number] Date of Birth: Oct 06, 1930   Medicare Important Message Given:  Yes    Adaja Wander, Chrystine OilerSharley Diane, RN 10/26/2017, 10:27 AM

## 2017-10-26 NOTE — Progress Notes (Signed)
[facility] per Dr. Gonzella Lexhungel. Pt's IV site D/C'd and WDL. Report called Merry ProudBrandi, nurse at [facility]. Verbalized understanding. Pt currently awaiting EMS arrival for transport.

## 2017-10-26 NOTE — Discharge Summary (Signed)
Physician Discharge Summary  CHICO CAWOOD ZOX:[id number] DOB: 1931/04/15 DOA: 10/21/2017  PCP: [name], DO  Admit date: 10/21/2017 Discharge date: 10/26/2017  Admitted From: home Disposition: SNF  Recommendations for Outpatient Follow-up:  1. Follow up MD at SNF in 1 weeks 2. follow up with neurology ( Dr Gerilyn Pilgrim) in 6 weeks.   Home Health:none Equipment/Devices:per PT at SNF  Discharge Condition:fair CODE STATUS: full code Diet recommendation: Heart Healthy     Discharge Diagnoses:  Principal Problem:    Acute ischemic left MCA stroke [facility])  Active Problems:   Atrial fibrillation (HCC)   CAD (coronary artery disease)   HTN (hypertension)   Hyperlipidemia   Alzheimer disease     Near syncope    Brief narrative Please refer to admission H&P for details, in brief, 84 year old male with history of Alzheimer's dementia, A. fib on Coumadin, coronary artery disease, mitral regurgitation and hyperlipidemia brought from home by his wife with frequent falls for the past 2-3 days. Patient had almost 4 episodes a fall in the past 2 days and normally uses a walker. Patient lives with his wife who is his primary caregiver and has difficulty helping him with transfers. In the ED vitals were stable. Initial blood work showed INR of 2.97, mild AKI with BUN of 20 and creatinine 1.47. CT head showed chronic small vessel ischemic changes and old lacunar infarct in the left pons and bilateral basal ganglia without acute stroke. Patient given IV fluid bolus and placed on observation.  MRI brain/MRA head done showing acute-subacute nonhemorrhagic infarct over left parietal lobe.  Hospital course Principal Problem: Acute ischemic stroke [facility]) Has acute/subacute stroke of the left parietal lobe which is likely contributed to his frequent falls recently. Carotid US without significant disease. Echo with normal EF, LVH and severe LA dilatation.  increased Lipitor dose to 40 mg daily.  LDL of 80.  Neurology consult appreciated, recommends switching aspirin and coumadin to eliquis.  (Will be started today.Marland Kitchen) Seen by PT and recommends SNF.  Active Problems:   Atrial fibrillation (CHADS2vasc of 6 ) (HCC) Heart rate Better controlled after metoprolol dose increased. Had 2.1 sec pause on tele on 1/31 so metoprolol dose reduced back to 25 mg . HR stable. Off aspirin and Coumadin. started on Eliquis.  Diarrhea with low-grade fever Resolved. Received hydration. Stool for C. Difficile negative.  Acute kidney injury Mild. Improved with gentle hydration.    Hyperlipidemia LDL of 80, increased lipitor to 40 mg daily    Alzheimer disease with dementia Continue aricept  Fall on 1/31 Fell at bedside with no injury sustained. Unwitnessed. Head CT done without any acute injury or intracranial bleed.       Family Communication  : wife and daughter at bedside  Disposition Plan  : SNF  Consults  : Neurology   Procedures  : Head CT MRI brain Carotid ultrasound 2-D echo      Discharge Instructions   Allergies as of 10/26/2017   No Known Allergies     Medication List    STOP taking these medications   aspirin 81 MG tablet   warfarin 5 MG tablet Commonly known as:  COUMADIN     TAKE these medications   acetaminophen 325 MG tablet Commonly known as:  TYLENOL Take 2 tablets (650 mg total) by mouth every 6 (six) hours as needed for mild pain or fever.   apixaban 5 MG Tabs tablet Commonly known as:  ELIQUIS Take 1 tablet (5 mg total) by  mouth 2 (two) times daily.   atorvastatin 40 MG tablet Commonly known as:  LIPITOR Take 1 tablet (40 mg total) by mouth daily at 6 PM. What changed:    medication strength  how much to take  when to take this   donepezil 10 MG tablet Commonly known as:  ARICEPT Take 10 mg by mouth at bedtime.   escitalopram 10 MG tablet Commonly known as:  LEXAPRO Take 5 mg by mouth daily.   metoprolol succinate  25 MG 24 hr tablet Commonly known as:  TOPROL-XL TAKE 1 TABLET DAILY What changed:    how much to take  how to take this  when to take this  additional instructions   multivitamin capsule Take 1 capsule by mouth at bedtime.      Follow-up Information    MD at SNF in 1 week Follow up.        Beryle Beamsoonquah, Kofi, MD. Schedule an appointment as soon as possible for a visit in 6 week(s).   Specialty:  Neurology Contact information: [address] [place] [address] [phone]          No Known Allergies   Procedures/Studies: Ct Head Wo Contrast  Result Date: 10/25/2017 CLINICAL DATA:  Fall on Coumadin.  History of Alzheimer's disease. EXAM: CT HEAD WITHOUT CONTRAST TECHNIQUE: Contiguous axial images were obtained from the base of the skull through the vertex without intravenous contrast. COMPARISON:  MRI of the head October 22, 2017 FINDINGS: BRAIN: No intraparenchymal hemorrhage, mass effect nor midline shift. Moderate to severe parenchymal brain volume loss. No hydrocephalus. Old bilateral basal ganglia lacunar infarcts and pontine lacunar infarcts. Patchy supratentorial white matter hypodensities less than expected for patient's age, though non-specific are most compatible with chronic small vessel ischemic disease. No acute large vascular territory infarcts. No abnormal extra-axial fluid collections. 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 Basal cisterns are patent. VASCULAR: Mild calcific atherosclerosis of the carotid siphons. SKULL: No skull fracture. Severe bilateral temporomandibular osteoarthrosis. No significant scalp soft tissue swelling. SINUSES/ORBITS: Mild RIGHT maxillary sinus mucosal thickening. Mastoid air cells are well aerated. Included ocular globes and orbital contents are non-suspicious. OTHER: None. IMPRESSION: 1. No acute intracranial process. 2. Stable examination including moderate to severe parenchymal brain volume loss and old lacunar infarcts. Electronically Signed    By: Awilda Metroourtnay  Bloomer M.D.   On: 10/25/2017 20:08   Ct Head Wo Contrast  Result Date: 10/21/2017 CLINICAL DATA:  Frequent falls at home, minor head trauma, history Alzheimer's, atrial fibrillation, coronary artery disease, hypertension EXAM: CT HEAD WITHOUT CONTRAST TECHNIQUE: Contiguous axial images were obtained from the base of the skull through the vertex without intravenous contrast. Sagittal and coronal MPR images reconstructed from axial data set. COMPARISON:  04/20/2017 FINDINGS: Brain: Generalized atrophy. Normal ventricular morphology. No midline shift or mass effect. Small vessel chronic ischemic changes of deep cerebral white matter. Old lacunar infarcts in BILATERAL basal ganglia and LEFT pons. No intracranial hemorrhage, mass lesion or evidence of acute infarction. No extra-axial fluid collections. Vascular: Atherosclerotic calcifications of internal carotid and vertebral arteries at skull base Skull: Intact Sinuses/Orbits: Clear Other: N/A IMPRESSION: Atrophy with small vessel chronic ischemic changes of deep cerebral white matter. Old lacunar infarcts in LEFT pons and BILATERAL basal ganglia. No acute intracranial abnormalities. Electronically Signed   By: Ulyses SouthwardMark  Boles M.D.   On: 10/21/2017 18:03   Mr Maxine GlennMra Head Wo Contrast  Result Date: 10/22/2017 CLINICAL DATA:  Ataxia. Stroke suspected. Weakness. Multiple recent falls. EXAM: MRI HEAD WITHOUT  CONTRAST MRA HEAD WITHOUT CONTRAST TECHNIQUE: Multiplanar, multiecho pulse sequences of the brain and surrounding structures were obtained without intravenous contrast. Angiographic images of the head were obtained using MRA technique without contrast. COMPARISON:  CT head without contrast 10/21/2016 FINDINGS: MRI HEAD FINDINGS Brain: The diffusion-weighted images demonstrate acute/subacute non hemorrhagic infarcts in the left parietal white matter adjacent to the atrium of the left lateral ventricle. There is also involvement of the posterior insular  cortex. Associated T2 signal changes are consistent with a subacute time frame. Advanced atrophy is present. Multiple remote lacunar infarcts are present in the basal ganglia bilaterally. Relatively minimal white matter change is present otherwise. Remote lacunar infarcts are present within the pons. Remote lacunar infarcts are present in the left greater than right cerebellum. Vascular: Flow is present in the major intracranial arteries. Skull and upper cervical spine: The skull base is within normal limits. The craniocervical junction is normal. A benign appearing pineal cyst is stable, measuring up to 12 mm. No significant solid tissue components are present. Marrow signal is normal. The upper cervical spine is unremarkable. Sinuses/Orbits: The paranasal sinuses and mastoid air cells are clear. Globes and orbits are within normal limits. MRA HEAD FINDINGS Time-of-flight images are somewhat degraded by patient motion. The internal carotid arteries are within normal limits from the high cervical segments through the ICA termini. Tortuosity of the cervical left ICA is likely related to hypertension. There is no significant stenosis. The A1 segments and left M1 segments are normal. Signal loss of the right M1 segment is felt to be artifactual. The MCA bifurcations are intact. There is asymmetric attenuation of left MCA branch vessels, exaggerated by artifact. Moderate right A2 segment stenosis is present. The right vertebral artery is dominant. The right PICA is visualized and normal. The left PICA is not visualized. The basilar artery is normal. There is significant signal loss in the proximal posterior cerebral arteries bilaterally. IMPRESSION: 1. Acute/subacute nonhemorrhagic white matter infarcts involving the left parietal lobe. 2. Advanced atrophy. 3. Multiple remote lacunar infarcts involving the basal ganglia, brainstem, and cerebellum. 4. No significant proximal stenosis, aneurysm, or occlusion within the  anterior circulation. 5. Moderate diffuse small vessel disease is exaggerated by patient motion. 6. Question proximal PCA stenoses bilaterally. These results will be called to the ordering clinician or representative by the Radiologist Assistant, and communication documented in the PACS or zVision Dashboard. Electronically Signed   By: Marin Roberts M.D.   On: 10/22/2017 11:09   Mr Brain Wo Contrast  Result Date: 10/22/2017 CLINICAL DATA:  Ataxia. Stroke suspected. Weakness. Multiple recent falls. EXAM: MRI HEAD WITHOUT CONTRAST MRA HEAD WITHOUT CONTRAST TECHNIQUE: Multiplanar, multiecho pulse sequences of the brain and surrounding structures were obtained without intravenous contrast. Angiographic images of the head were obtained using MRA technique without contrast. COMPARISON:  CT head without contrast 10/21/2016 FINDINGS: MRI HEAD FINDINGS Brain: The diffusion-weighted images demonstrate acute/subacute non hemorrhagic infarcts in the left parietal white matter adjacent to the atrium of the left lateral ventricle. There is also involvement of the posterior insular cortex. Associated T2 signal changes are consistent with a subacute time frame. Advanced atrophy is present. Multiple remote lacunar infarcts are present in the basal ganglia bilaterally. Relatively minimal white matter change is present otherwise. Remote lacunar infarcts are present within the pons. Remote lacunar infarcts are present in the left greater than right cerebellum. Vascular: Flow is present in the major intracranial arteries. Skull and upper cervical spine: The skull base is within normal limits.  The craniocervical junction is normal. A benign appearing pineal cyst is stable, measuring up to 12 mm. No significant solid tissue components are present. Marrow signal is normal. The upper cervical spine is unremarkable. Sinuses/Orbits: The paranasal sinuses and mastoid air cells are clear. Globes and orbits are within normal limits.  MRA HEAD FINDINGS Time-of-flight images are somewhat degraded by patient motion. The internal carotid arteries are within normal limits from the high cervical segments through the ICA termini. Tortuosity of the cervical left ICA is likely related to hypertension. There is no significant stenosis. The A1 segments and left M1 segments are normal. Signal loss of the right M1 segment is felt to be artifactual. The MCA bifurcations are intact. There is asymmetric attenuation of left MCA branch vessels, exaggerated by artifact. Moderate right A2 segment stenosis is present. The right vertebral artery is dominant. The right PICA is visualized and normal. The left PICA is not visualized. The basilar artery is normal. There is significant signal loss in the proximal posterior cerebral arteries bilaterally. IMPRESSION: 1. Acute/subacute nonhemorrhagic white matter infarcts involving the left parietal lobe. 2. Advanced atrophy. 3. Multiple remote lacunar infarcts involving the basal ganglia, brainstem, and cerebellum. 4. No significant proximal stenosis, aneurysm, or occlusion within the anterior circulation. 5. Moderate diffuse small vessel disease is exaggerated by patient motion. 6. Question proximal PCA stenoses bilaterally. These results will be called to the ordering clinician or representative by the Radiologist Assistant, and communication documented in the PACS or zVision Dashboard. Electronically Signed   By: Marin Roberts M.D.   On: 10/22/2017 11:09   US Carotid Bilateral  Result Date: 10/22/2017 CLINICAL DATA:  Near syncopal episode. History of CAD (post CABG) and hyperlipidemia. EXAM: BILATERAL CAROTID DUPLEX ULTRASOUND TECHNIQUE: Wallace Cullens scale imaging, color Doppler and duplex ultrasound were performed of bilateral carotid and vertebral arteries in the neck. COMPARISON:  None. FINDINGS: Criteria: Quantification of carotid stenosis is based on velocity parameters that correlate the residual internal  carotid diameter with NASCET-based stenosis levels, using the diameter of the distal internal carotid lumen as the denominator for stenosis measurement. The following velocity measurements were obtained: RIGHT ICA:  94/26 cm/sec CCA:  94/18 cm/sec SYSTOLIC ICA/CCA RATIO:  1.0 DIASTOLIC ICA/CCA RATIO:  1.5 ECA:  85 cm/sec LEFT ICA:  70/20 cm/sec CCA:  75/10 cm/sec SYSTOLIC ICA/CCA RATIO:  0.9 DIASTOLIC ICA/CCA RATIO:  1.9 ECA:  72 cm/sec RIGHT CAROTID ARTERY: There is a minimal amount of echogenic plaque within the right carotid bulb (image 16). There is a minimal to moderate amount of echogenic plaque involving the origin and proximal aspects of the right internal carotid artery (image 24), not resulting in elevated peak systolic velocities within the interrogated course the right internal carotid artery to suggest a hemodynamically significant stenosis. RIGHT VERTEBRAL ARTERY:  Antegrade flow LEFT CAROTID ARTERY: There is a moderate amount of mixed echogenic plaque within the left carotid bulb (images 49 and 50), not resulting in elevated peak systolic velocities within the interrogated course the left internal carotid artery to suggest a hemodynamically significant stenosis. LEFT VERTEBRAL ARTERY:  Antegrade flow IMPRESSION: Minimal to moderate amount of bilateral atherosclerotic plaque, not resulting in a hemodynamically significant stenosis within either internal carotid artery. Electronically Signed   By: Simonne Come M.D.   On: 10/22/2017 11:23   2 D echo Study Conclusions  - Left ventricle: The cavity size was normal. Wall thickness was   increased increased in a pattern of mild to moderate LVH.   Systolic function  was normal. The estimated ejection fraction was   in the range of 55% to 60%. Wall motion was normal; there were no   regional wall motion abnormalities. The study is not technically   sufficient to allow evaluation of LV diastolic function. - Aortic valve: Mildly calcified annulus.  Trileaflet; mildly   calcified leaflets. Left coronary cusp mobility was restricted.   There was mild regurgitation. - Mitral valve: Mildly calcified annulus. There was moderate   regurgitation. - Left atrium: The atrium was severely dilated. - Right atrium: The atrium was moderately dilated. Central venous   pressure (est): 3 mm Hg. - Atrial septum: No defect or patent foramen ovale was identified. - Tricuspid valve: There was mild regurgitation. - Pulmonary arteries: PA peak pressure: 42 mm Hg (S). - Pericardium, extracardiac: There was no pericardial effusion.  Impressions:  - Mild to moderate LVH with LVEF 55-60%. Indeterminate diastolic   function. Severe left atrial enlargement. Mildly calcified mitral   annulus with moderate mitral regurgitation. Sclerotic aortic   valve with mild aortic regurgitation. Mild tricuspid   regurgitation with PASP estimated 42 mmHg.    Subjective: 2.1 sec pause on telemetry yesterday. Also fell at bedside, unwitnessed. CT head negative. No other  overnight events  Discharge Exam: Vitals:   10/25/17 1900 10/26/17 0645  BP: (!) 128/58 135/69  Pulse: 88 70  Resp: 20 16  Temp: 98.8 F (37.1 C) 98.5 F (36.9 C)  SpO2: 99% 97%   Vitals:   10/25/17 1143 10/25/17 1617 10/25/17 1900 10/26/17 0645  BP: (!) 146/61 (!) 143/65 (!) 128/58 135/69  Pulse: 79 80 88 70  Resp:   20 16  Temp: 98.5 F (36.9 C) 97.9 F (36.6 C) 98.8 F (37.1 C) 98.5 F (36.9 C)  TempSrc: Oral Oral Oral Oral  SpO2: 100% 98% 99% 97%  Weight:      Height:        Gen.: Elderly male, confused, not in distress HEENT: Moist mucosa, supple Neck Chest: Clear bilaterally CVS : S1 and S2 irregular, 3/6 systolic murmur GI: Soft, nondistended, Nontender Musculoskeletal: Warm, no edema CNS: Alert and awake, confused, nonfoc       The results of significant diagnostics from this hospitalization (including imaging, microbiology, ancillary and laboratory) are listed  below for reference.     Microbiology: Recent Results (from the past 240 hour(s))  C difficile quick scan w PCR reflex     Status: None   Collection Time: 10/24/17  6:40 AM  Result Value Ref Range Status   C Diff antigen NEGATIVE NEGATIVE Final   C Diff toxin NEGATIVE NEGATIVE Final   C Diff interpretation No C. difficile detected.  Final     Labs: BNP (last 3 results) No results for input(s): BNP in the last 8760 hours. Basic Metabolic Panel: Recent Labs  Lab 10/21/17 1703 10/22/17 0605 10/25/17 0551  NA 141 140 138  K 4.4 4.3 3.6  CL 106 105 107  CO2 26 26 22   GLUCOSE 161* 96 92  BUN 28* 21* 26*  CREATININE 1.47* 1.33* 1.36*  CALCIUM 9.3 8.8* 8.2*  MG 1.9  --   --    Liver Function Tests: Recent Labs  Lab 10/21/17 1703  AST 23  ALT 18  ALKPHOS 55  BILITOT 0.7  PROT 6.7  ALBUMIN 3.4*   No results for input(s): LIPASE, AMYLASE in the last 168 hours. No results for input(s): AMMONIA in the last 168 hours. CBC: Recent Labs  Lab 10/21/17  1703 10/25/17 0551  WBC 9.6 7.4  HGB 13.0 13.1  HCT 39.3 39.1  MCV 98.0 98.2  PLT 144* 166   Cardiac Enzymes: No results for input(s): CKTOTAL, CKMB, CKMBINDEX, TROPONINI in the last 168 hours. BNP: Invalid input(s): POCBNP CBG: No results for input(s): GLUCAP in the last 168 hours. D-Dimer No results for input(s): DDIMER in the last 72 hours. Hgb A1c No results for input(s): HGBA1C in the last 72 hours. Lipid Profile No results for input(s): CHOL, HDL, LDLCALC, TRIG, CHOLHDL, LDLDIRECT in the last 72 hours. Thyroid function studies No results for input(s): TSH, T4TOTAL, T3FREE, THYROIDAB in the last 72 hours.  Invalid input(s): FREET3 Anemia work up No results for input(s): VITAMINB12, FOLATE, FERRITIN, TIBC, IRON, RETICCTPCT in the last 72 hours. Urinalysis    Component Value Date/Time   COLORURINE YELLOW 10/21/2017 2054   APPEARANCEUR HAZY (A) 10/21/2017 2054   LABSPEC 1.015 10/21/2017 2054   PHURINE 5.0  10/21/2017 2054   GLUCOSEU NEGATIVE 10/21/2017 2054   HGBUR NEGATIVE 10/21/2017 2054   BILIRUBINUR NEGATIVE 10/21/2017 2054   KETONESUR 5 (A) 10/21/2017 2054   PROTEINUR 30 (A) 10/21/2017 2054   UROBILINOGEN 1.0 06/17/2010 1239   NITRITE NEGATIVE 10/21/2017 2054   LEUKOCYTESUR NEGATIVE 10/21/2017 2054   Sepsis Labs Invalid input(s): PROCALCITONIN,  WBC,  LACTICIDVEN Microbiology Recent Results (from the past 240 hour(s))  C difficile quick scan w PCR reflex     Status: None   Collection Time: 10/24/17  6:40 AM  Result Value Ref Range Status   C Diff antigen NEGATIVE NEGATIVE Final   C Diff toxin NEGATIVE NEGATIVE Final   C Diff interpretation No C. difficile detected.  Final     Time coordinating discharge: Over 30 minutes  SIGNED:   Eddie North, MD  [facility] 10/26/2017, 10:19 AM Pager   If 7PM-7AM, please contact night-coverage [email or web address] Password [email or web address]

## 2017-11-26 ENCOUNTER — Ambulatory Visit: Payer: Medicare Other | Admitting: Cardiovascular Disease

## 2018-08-19 IMAGING — CT CT HEAD W/O CM
3 series · 15 of 47 positions shown, 18 images · non-contrast
Comparison: MRI of the head October 22, 2017

CLINICAL DATA: Fall on Coumadin.  History of Alzheimer's disease.

EXAM:
CT HEAD WITHOUT CONTRAST
TECHNIQUE: Contiguous axial images were obtained from the base of the skull
through the vertex without intravenous contrast.

[Series 2: head wo · axial · 0.47mm/px · z∈[+1391,+1516]mm · 9 of 31 slices shown, 12 images]
[im 3/31  brain]
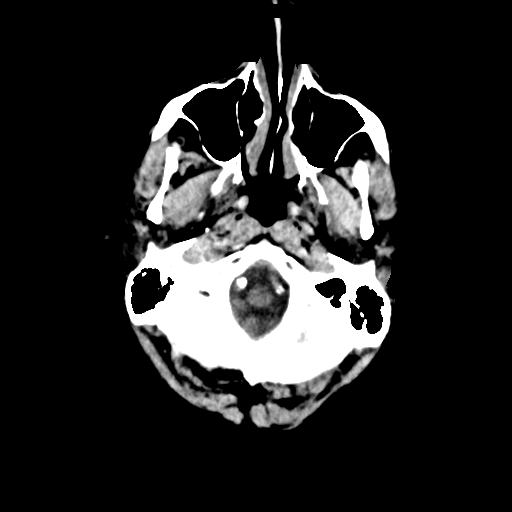
[im 3/31  bone]
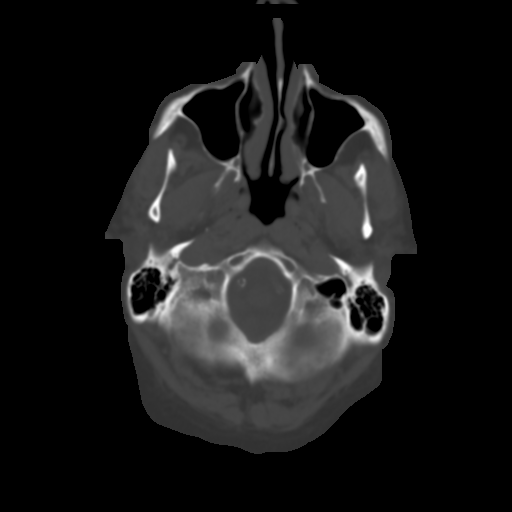
[im 6/31  brain]
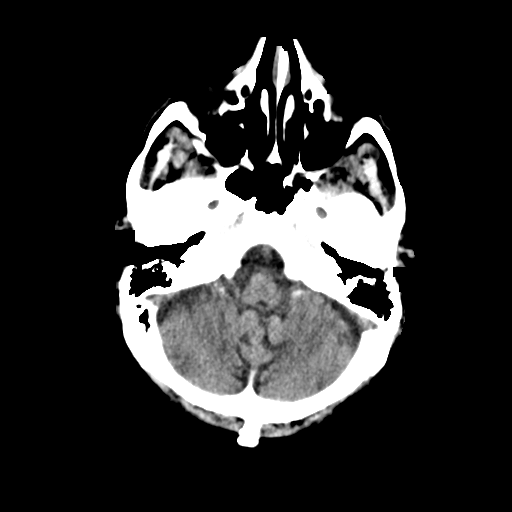
[im 9/31  brain]
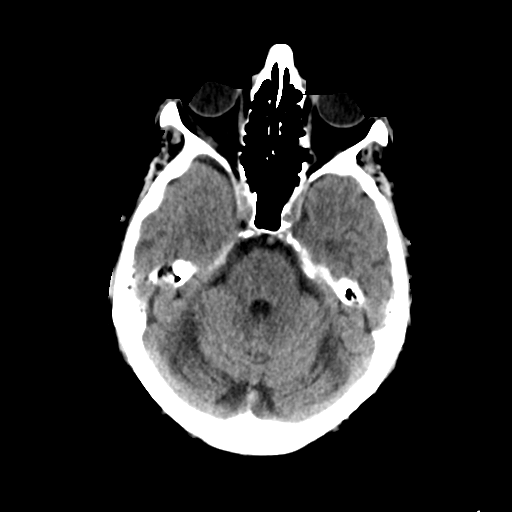
[im 12/31  brain]
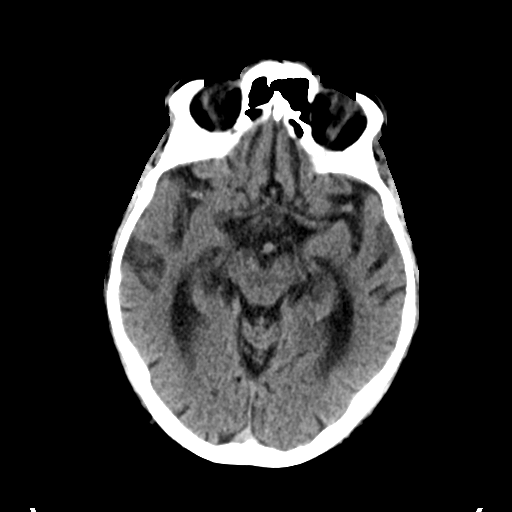
[im 16/31  brain]
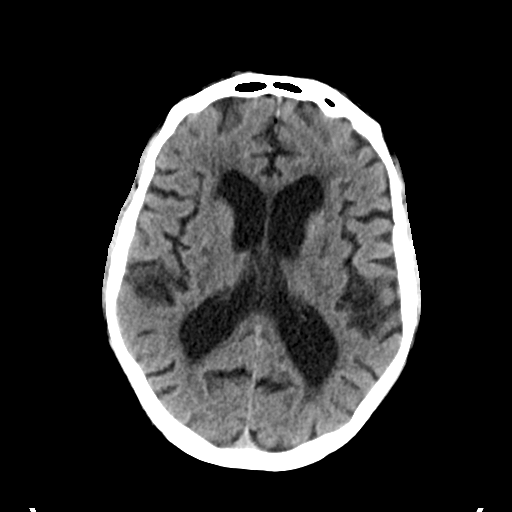
[im 16/31  bone]
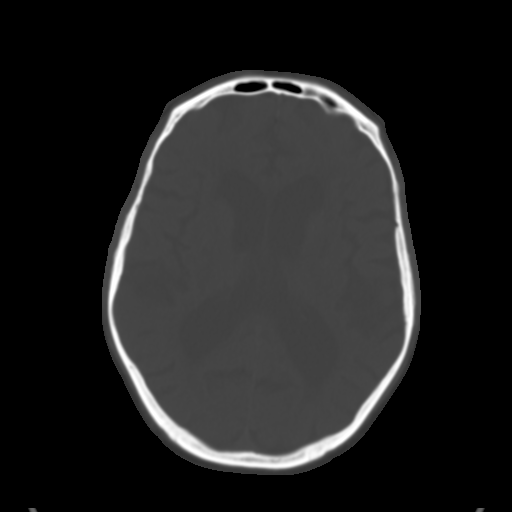
[im 19/31  brain]
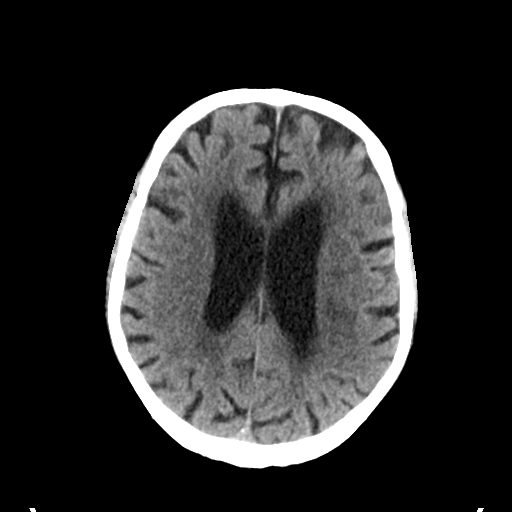
[im 22/31  brain]
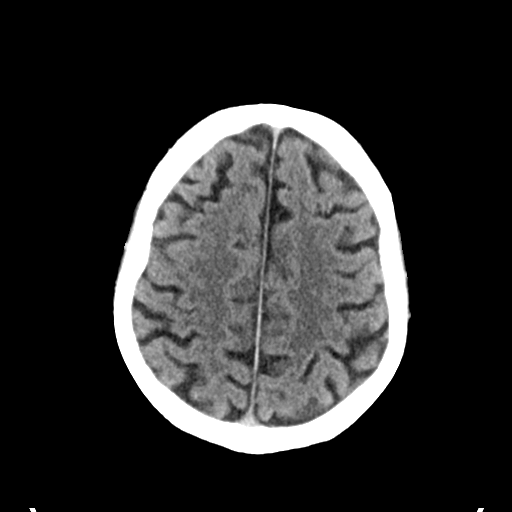
[im 25/31  brain]
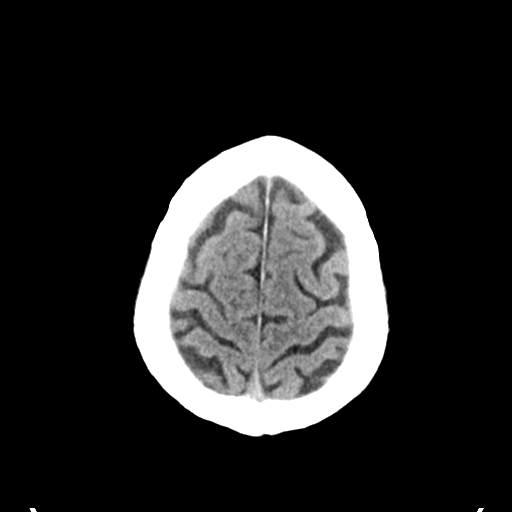
[im 28/31  brain]
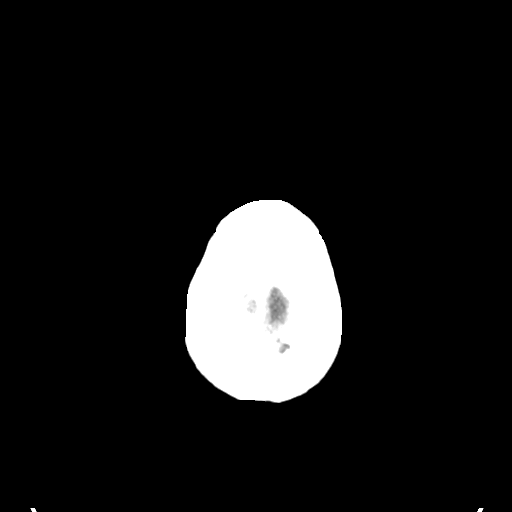
[im 28/31  bone]
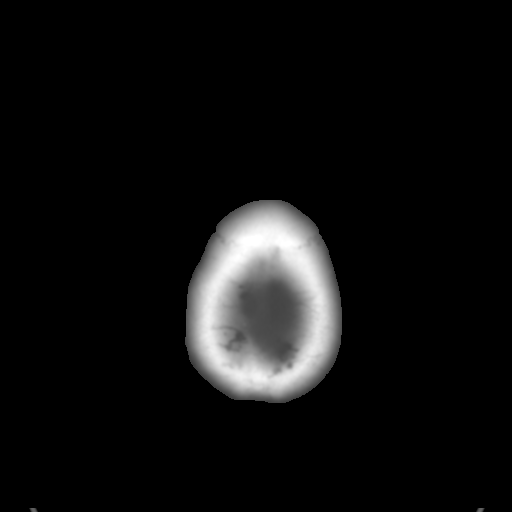

[Series 4: coronal soft tissue · coronal · 0.37mm/px · 3 of 74 slices shown]
[im 25/74  brain]
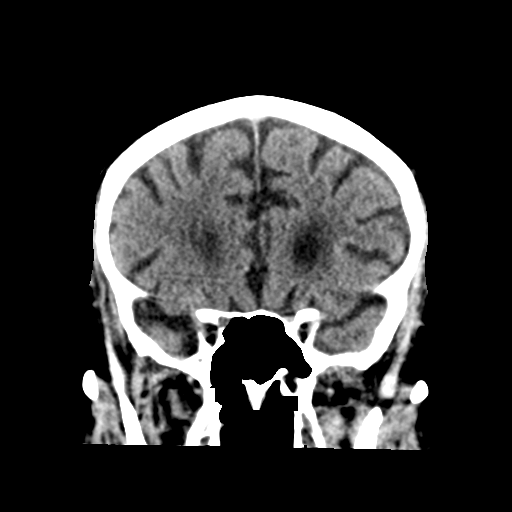
[im 33/74  brain]
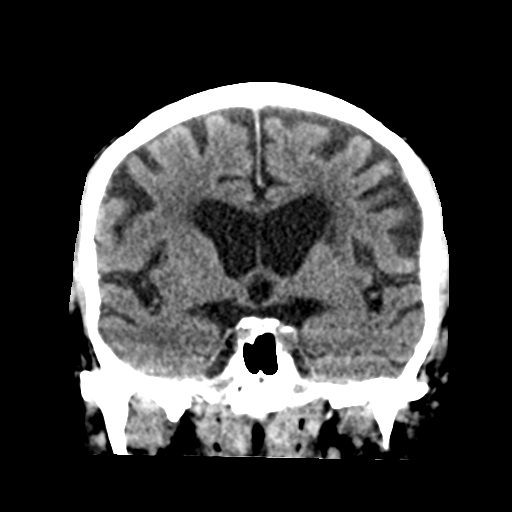
[im 41/74  brain]
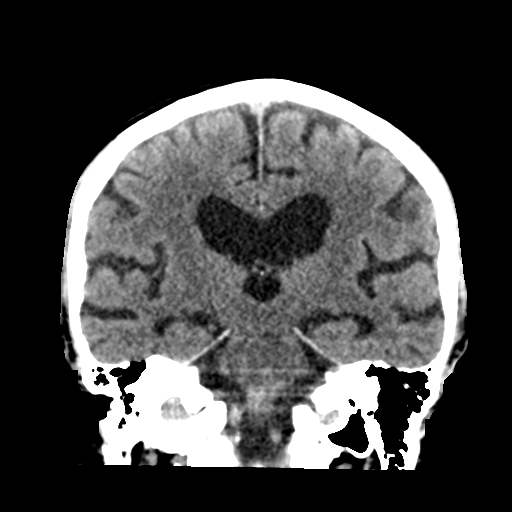

[Series 5: sagittal soft tissue · sagittal · 0.34mm/px · 3 of 67 slices shown]
[im 23/67  brain]
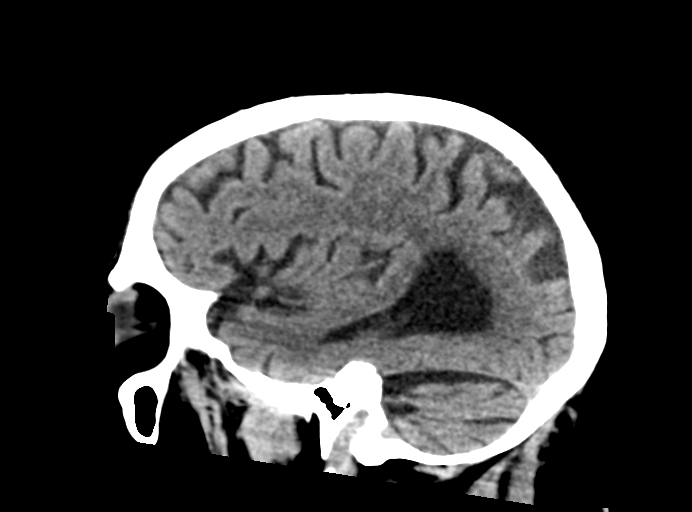
[im 34/67  brain]
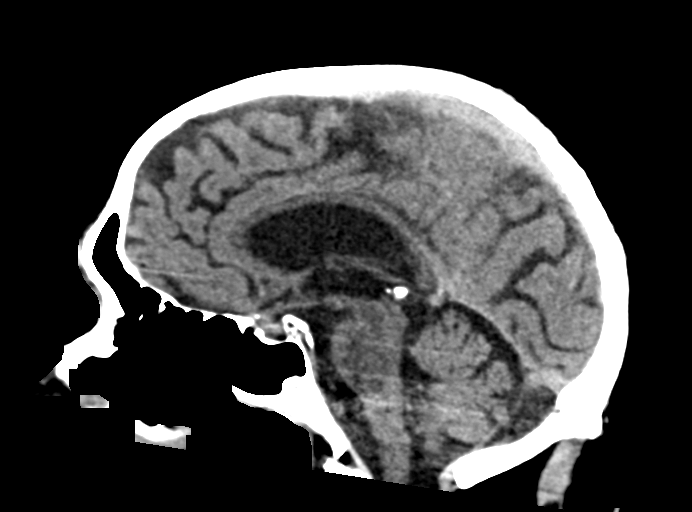
[im 45/67  brain]
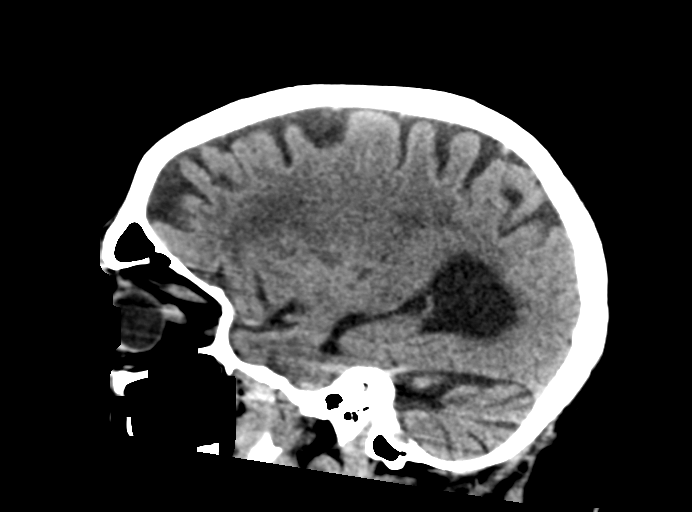

[15 of 47 positions shown; findings below may reference images not displayed]

FINDINGS: BRAIN: No intraparenchymal hemorrhage, mass effect nor midline
shift. Moderate to severe parenchymal brain volume loss. No
hydrocephalus. Old bilateral basal ganglia lacunar infarcts and
pontine lacunar infarcts. Patchy supratentorial white matter
hypodensities less than expected for patient's age, though
non-specific are most compatible with chronic small vessel ischemic
disease. No acute large vascular territory infarcts. No abnormal
extra-axial fluid collections. 11 mm pineal cyst. Basal cisterns are
patent.

VASCULAR: Mild calcific atherosclerosis of the carotid siphons.

SKULL: No skull fracture. Severe bilateral temporomandibular
osteoarthrosis. No significant scalp soft tissue swelling.

SINUSES/ORBITS: Mild RIGHT maxillary sinus mucosal thickening.
Mastoid air cells are well aerated. Included ocular globes and
orbital contents are non-suspicious.

OTHER: None.
IMPRESSION: 1. No acute intracranial process.
2. Stable examination including moderate to severe parenchymal brain
volume loss and old lacunar infarcts.

## 2019-08-26 DEATH — deceased
